# Patient Record
Sex: Female | Born: 1975 | Race: Black or African American | Hispanic: Yes | Marital: Married | Smoking: Never smoker
Health system: Southern US, Community
[De-identification: ages and names within clinical notes are randomized; demographics above are authoritative.]

## PROBLEM LIST (undated history)

## (undated) DIAGNOSIS — D573 Sickle-cell trait: Secondary | ICD-10-CM

## (undated) HISTORY — PX: TUBAL LIGATION: SHX77

---

## 1998-10-25 ENCOUNTER — Other Ambulatory Visit: Admission: RE | Admit: 1998-10-25 | Discharge: 1998-10-25 | Payer: Self-pay | Admitting: *Deleted

## 2001-04-23 ENCOUNTER — Other Ambulatory Visit: Admission: RE | Admit: 2001-04-23 | Discharge: 2001-04-23 | Payer: Self-pay | Admitting: Obstetrics and Gynecology

## 2001-05-07 ENCOUNTER — Encounter: Payer: Self-pay | Admitting: Obstetrics and Gynecology

## 2001-05-07 ENCOUNTER — Ambulatory Visit (HOSPITAL_COMMUNITY): Admission: RE | Admit: 2001-05-07 | Discharge: 2001-05-07 | Payer: Self-pay | Admitting: Obstetrics and Gynecology

## 2001-08-30 ENCOUNTER — Inpatient Hospital Stay (HOSPITAL_COMMUNITY): Admission: AD | Admit: 2001-08-30 | Discharge: 2001-09-02 | Payer: Self-pay | Admitting: Obstetrics and Gynecology

## 2001-08-30 ENCOUNTER — Encounter (INDEPENDENT_AMBULATORY_CARE_PROVIDER_SITE_OTHER): Payer: Self-pay | Admitting: *Deleted

## 2004-06-15 ENCOUNTER — Ambulatory Visit: Payer: Self-pay | Admitting: Internal Medicine

## 2004-07-29 ENCOUNTER — Ambulatory Visit: Payer: Self-pay | Admitting: Internal Medicine

## 2010-02-03 ENCOUNTER — Emergency Department (HOSPITAL_BASED_OUTPATIENT_CLINIC_OR_DEPARTMENT_OTHER)
Admission: EM | Admit: 2010-02-03 | Discharge: 2010-02-03 | Payer: Self-pay | Source: Home / Self Care | Admitting: Emergency Medicine

## 2010-10-07 NOTE — Op Note (Signed)
[facility]  Patient:    Megan Horn, Megan Horn Visit Number: [id number] MRN: [id number]          Service Type: OBS Location: 910A 9106 01 Attending Physician:  Osborn Coho Dictated by:   Janeece Riggers Dareen Piano, M.D. Proc. Date: 08/30/01 Admit Date:  08/30/2001 Discharge Date: 09/02/2001                             Operative Report  PREOPERATIVE DIAGNOSIS:       1. Intrauterine pregnancy at 37 weeks estimated gestational age.                               2. History of prior primary low transverse cesarean section.                               3. History of prior ruptured uterine scar, after a V-back attempt.                               4. The patient desires permanent sterilization.  POSTOPERATIVE DIAGNOSES:      1. Intrauterine pregnancy at 37 weeks estimated gestational age.                               2. History of prior primary low transverse cesarean section.                               3. History of prior ruptured uterine scar, after a V-back attempt.                               4. The patient desires permanent sterilization.  PROCEDURES:                   1. Repeat low transverse cesarean section.                               2. Bilateral tubal ligation, Pomeroy.  SURGEON:                      Mark E. Dareen Piano, M.D.  ANESTHESIA:                   Spinal.  ANTIBIOTICS:                  Ancef 1 g.  ESTIMATED BLOOD LOSS:         900 cc.  COMPLICATIONS:                None.  DRAINS:                       Flow to bedside drainage.  SPECIMEN:                     Portion of right and left fallopian tubes sent to pathology.  FINDINGS:                     The  patient had normal appearing fallopian tubes bilaterally.  Normal ovaries bilaterally.  The uterus was normal.  The uterine cavity was normal.  DESCRIPTION OF PROCEDURE:     The patient was taken to the operating room, where a spinal anesthetic was administered without  complications.  She was then placed in the dorsal supine position, with the left lateral tilt.  Once an adequate level was reached, the patient was prepped with Hibiclens and a Foley catheter was placed.  She was then draped in the usual fashion post procedure.   A Pfannenstiel incision was then made through the previous scar. This was taken down to the fascia.  The fascia was entered in the midline and extended laterally with the Mayo scissors.  The rectus muscles were then dissected from the fascia with the Bovie.  Rectus muscle was divided midline and taken superiorly and inferiorly.  parietoperitoneum was entered sharply. The bladder flap was taken down sharply, and a low transverse uterine incision was made at the midline and extended laterally with blunt dissection. Amniotic sac was entered sharply.  The fluid was noted to be clear.  The infant was delivered with the vacuum extractor in the vertex presentation. The cord was then doubly clamped and cut, and the infant handed to the awaiting NICU team.  Cord blood was then obtained.  The placenta was then manually removed.  Uterine cavity was wiped with a wet lap. The uterine incision was closed in a single layer of 0 chromic in a running, locking fashion.  The bladder flap was then closed.  The right fallopian tube was then grasped with a Babcock in the isthmic portion.  A 2 cm portion was then doubly ligated with O PDS.  The knuckle was then excised, both ostia were visualized.  The specimen was handed off.  A similar procedure was performed on the opposite side.  The uterus was then placed into the abdominal cavity, and the abdominal gutters were wiped with a wet lap.  The uterine incision was again checked, and felt to be hemostatic.  The parietoperitoneum and rectus muscles were approximated midline, using 3-0 chromic in running fashion.  The fascia was closed using 0 Monocryl in a running fashion.  The subcuticular tissue  was made hemostatic with the Bovie.  Stainless-steel clips were used to close the skin.  The patient tolerated the procedure well. She was taken to the recovery room in stable condition.  Instrument and lap counts were correct x2. Dictated by:   Janeece Riggers Dareen Piano, M.D. Attending Physician:  Osborn Coho DD:  08/30/01 TD:  09/01/01 Job: [id number]

## 2010-10-07 NOTE — Discharge Summary (Signed)
[facility]  Patient:    Megan Horn, Megan Horn Visit Number: [id number] MRN: [id number]          Service Type: OBS Location: 910A 9106 01 Attending Physician:  Osborn Coho Dictated by:   Leilani Able, P.A. Admit Date:  08/30/2001 Discharge Date: 09/02/2001                             Discharge Summary  FINAL DIAGNOSES:              1. Intrauterine pregnancy at 37 weeks estimated                                  gestational age.                               2. History of prior primary low transverse                                  cesarean section.                               3. History of prior ruptured uterine scar after                                  an attempted vaginal birth after cesarean                                  section.                               4. Patient desires permanent sterilization.  PROCEDURE:                    1. Repeat low transverse cesarean section.                               2. Bilateral tubal ligation using the Pomeroy                                  method.  SURGEON:                      Mark E. Dareen Piano, M.D.  COMPLICATIONS:                None.  HISTORY OF PRESENT ILLNESS:   This 35 year old, G6, P2-0-3-2 presents at [redacted] weeks gestation to triage with spontaneous rupture of membrane.  The patient was scheduled for repeat cesarean section on September 16, 2001.  She does have a history of a ruptured uterine scar during VBAC.  The patient is therefore, taken to the operating room for a repeat cesarean section and a tubal ligation.  HOSPITAL COURSE:              The patient was taken to the operating room by Dr. Malva Limes on August 30, 2001 where a repeat low transverse cesarean section was performed with the delivery of a 6 pounds 10 ounces female infant with Apgars of 9 and 9.  Delivery went without complication.  The patient had a bilateral tubal ligation performed using the Pomeroy method.   The procedure went without complication.  The patients postoperative course was benign without significant fevers.  She was felt ready for discharge on postoperative day three.  She was sent home on a regular diet.  She was told to decrease activity.  She was told to continue prenatal vitamin. She was given a prescription for Percocet one to two every four hours as needed for pain.  She was to follow up in the office on September 03, 2001 to remove her staples. Dictated by:   Leilani Able, P.A. Attending Physician:  Osborn Coho DD:  09/19/01 TD:  09/23/01 Job: [id number]

## 2011-07-25 ENCOUNTER — Emergency Department (HOSPITAL_COMMUNITY): Payer: Managed Care, Other (non HMO)

## 2011-07-25 ENCOUNTER — Encounter (HOSPITAL_COMMUNITY): Payer: Self-pay | Admitting: Emergency Medicine

## 2011-07-25 ENCOUNTER — Emergency Department (HOSPITAL_COMMUNITY)
Admission: EM | Admit: 2011-07-25 | Discharge: 2011-07-25 | Disposition: A | Discharge status: Home / Self Care | Payer: Managed Care, Other (non HMO) | Attending: Emergency Medicine | Admitting: Emergency Medicine

## 2011-07-25 ENCOUNTER — Other Ambulatory Visit: Payer: Self-pay

## 2011-07-25 DIAGNOSIS — R112 Nausea with vomiting, unspecified: Secondary | ICD-10-CM | POA: Insufficient documentation

## 2011-07-25 DIAGNOSIS — R079 Chest pain, unspecified: Secondary | ICD-10-CM | POA: Insufficient documentation

## 2011-07-25 DIAGNOSIS — K529 Noninfective gastroenteritis and colitis, unspecified: Secondary | ICD-10-CM

## 2011-07-25 DIAGNOSIS — R197 Diarrhea, unspecified: Secondary | ICD-10-CM | POA: Insufficient documentation

## 2011-07-25 DIAGNOSIS — D573 Sickle-cell trait: Secondary | ICD-10-CM | POA: Insufficient documentation

## 2011-07-25 HISTORY — DX: Sickle-cell trait: D57.3

## 2011-07-25 LAB — COMPREHENSIVE METABOLIC PANEL
ALT: 14 U/L (ref 0–35)
AST: 17 U/L (ref 0–37)
Calcium: 9.3 mg/dL (ref 8.4–10.5)
Sodium: 141 mEq/L (ref 135–145)
Total Protein: 7.8 g/dL (ref 6.0–8.3)

## 2011-07-25 LAB — DIFFERENTIAL
Basophils Absolute: 0 10*3/uL (ref 0.0–0.1)
Eosinophils Absolute: 0.1 10*3/uL (ref 0.0–0.7)
Eosinophils Relative: 1 % (ref 0–5)
Lymphocytes Relative: 16 % (ref 12–46)
Neutrophils Relative %: 77 % (ref 43–77)

## 2011-07-25 LAB — CBC
MCV: 70.6 fL — ABNORMAL LOW (ref 78.0–100.0)
Platelets: 289 10*3/uL (ref 150–400)
RDW: 17.5 % — ABNORMAL HIGH (ref 11.5–15.5)
WBC: 6.8 10*3/uL (ref 4.0–10.5)

## 2011-07-25 MED ORDER — ONDANSETRON HCL 4 MG PO TABS: 4.0000 mg | ORAL_TABLET | Freq: Four times a day (QID) | ORAL | Status: AC

## 2011-07-25 MED ORDER — ONDANSETRON 4 MG PO TBDP
8.0000 mg | ORAL_TABLET | Freq: Once | ORAL | Status: AC
  Administered 2011-07-25: 8 mg via ORAL
  Filled 2011-07-25: qty 2

## 2011-07-25 NOTE — ED Notes (Signed)
Started w/ cough then vomiting and now had chest pain  After she vomited so hard she had blood in it

## 2011-07-25 NOTE — ED Provider Notes (Signed)
History     CSN: [id number]  Arrival date & time 07/25/11  1243   First MD Initiated Contact with Patient 07/25/11 1626      Chief Complaint  Patient presents with  . Emesis    (Consider location/radiation/quality/duration/timing/severity/associated sxs/prior treatment) HPI Comments: Patient presents today with symptoms for the last 2 days of nausea, vomiting diarrhea.  No focal abdominal pain.  She did note that she had some vomiting with a small amount of blood in it and that concerned her.  She had some chest pain after an episode of emesis as well.  She notes that some similar symptoms have been found in other family members in the same household.  Last menstrual period was this past week and normal for her.  Patient is a 36 y.o. female presenting with vomiting. The history is provided by the patient. No language interpreter was used.  Emesis  This is a new problem. The current episode started more than 2 days ago. The problem occurs 2 to 4 times per day. The problem has been gradually improving. The emesis has an appearance of stomach contents. There has been no fever. Associated symptoms include diarrhea. Pertinent negatives include no abdominal pain, no chills, no cough, no fever and no headaches.    Past Medical History  Diagnosis Date  . Sickle-cell trait     Past Surgical History  Procedure Date  . Cesarean section     History reviewed. No pertinent family history.  History  Substance Use Topics  . Smoking status: Never Smoker   . Smokeless tobacco: Not on file  . Alcohol Use: Yes    OB History    Grav Para Term Preterm Abortions TAB SAB Ect Mult Living                  Review of Systems  Constitutional: Negative.  Negative for fever and chills.  HENT: Negative.   Eyes: Negative.  Negative for discharge and redness.  Respiratory: Negative.  Negative for cough and shortness of breath.   Cardiovascular: Negative.  Negative for chest pain.  Gastrointestinal:  Positive for nausea, vomiting and diarrhea. Negative for abdominal pain.  Genitourinary: Negative.  Negative for dysuria and vaginal discharge.  Musculoskeletal: Negative.  Negative for back pain.  Skin: Negative.  Negative for color change and rash.  Neurological: Negative.  Negative for syncope and headaches.  Hematological: Negative.  Negative for adenopathy.  Psychiatric/Behavioral: Negative.  Negative for confusion.  All other systems reviewed and are negative.    Allergies  Review of patient's allergies indicates no known allergies.  Home Medications   Current Outpatient Rx  Name Route Sig Dispense Refill  . KAOLIN-PECTIN 5.85-0.13 GM/30ML PO SUSP Oral Take 30 mLs by mouth 2 (two) times daily as needed. For indigestion    . NAPROXEN SODIUM 220 MG PO TABS Oral Take 220 mg by mouth 2 (two) times daily as needed. For pain      BP 124/92  Pulse 84  Temp 98.2 F (36.8 C)  Resp 16  SpO2 100%  LMP 07/21/2011  Physical Exam  Nursing note and vitals reviewed. Constitutional: She is oriented to person, place, and time. She appears well-developed and well-nourished.  Non-toxic appearance. She does not have a sickly appearance.  HENT:  Head: Normocephalic and atraumatic.  Eyes: Conjunctivae, EOM and lids are normal. Pupils are equal, round, and reactive to light. No scleral icterus.  Neck: Trachea normal and normal range of motion. Neck supple.  Cardiovascular:  Normal rate, regular rhythm and normal heart sounds.   Pulmonary/Chest: Effort normal and breath sounds normal. No respiratory distress. She has no wheezes. She has no rales.  Abdominal: Soft. Normal appearance. She exhibits no distension. There is no tenderness. There is no rebound, no guarding and no CVA tenderness.  Musculoskeletal: Normal range of motion.  Neurological: She is alert and oriented to person, place, and time. She has normal strength.  Skin: Skin is warm, dry and intact. No rash noted.  Psychiatric: She  has a normal mood and affect. Her behavior is normal. Judgment and thought content normal.    ED Course  Procedures (including critical care time)  Labs Reviewed  CBC - Abnormal; Notable for the following:    Hemoglobin 10.7 (*)    HCT 32.6 (*)    MCV 70.6 (*)    MCH 23.2 (*)    RDW 17.5 (*)    All other components within normal limits  COMPREHENSIVE METABOLIC PANEL - Abnormal; Notable for the following:    Potassium 3.3 (*)    Total Bilirubin 0.2 (*)    All other components within normal limits  DIFFERENTIAL  TROPONIN I   Dg Chest 2 View  07/25/2011  *RADIOLOGY REPORT*  Clinical Data: Chest pain, cough, fever  CHEST - 2 VIEW  Comparison: None.  Findings: The lungs are clear.  Mediastinal contours are normal. The heart is within normal limits in size.  No bony abnormality is seen.  IMPRESSION: No active lung disease.  Original Report Authenticated By: Juline Patch, M.D.     No diagnosis found.    MDM  Patient with likely gastroenteritis given that other family members have had similar symptoms of vomiting and diarrhea over the last few days.  She has a soft benign abdominal exam at this time.  She has normal vital signs at this time.  Patient has not had any emesis since earlier this morning as well.  I'll give the patient Zofran here and Zofran to be used at home.  She's been counseled regarding BRAT diet and continued hydration at home.        Nat Christen, MD 07/25/11 [phone]

## 2011-07-25 NOTE — Discharge Instructions (Signed)
Viral Gastroenteritis   Viral gastroenteritis is also known as stomach flu. This condition affects the stomach and intestinal tract. It can cause sudden diarrhea and vomiting. The illness typically lasts 3 to 8 days. Most people develop an immune response that eventually gets rid of the virus. While this natural response develops, the virus can make you quite ill.   CAUSES   Many different viruses can cause gastroenteritis, such as rotavirus or noroviruses. You can catch one of these viruses by consuming contaminated food or water. You may also catch a virus by sharing utensils or other personal items with an infected person or by touching a contaminated surface.   SYMPTOMS   The most common symptoms are diarrhea and vomiting. These problems can cause a severe loss of body fluids (dehydration) and a body salt (electrolyte) imbalance. Other symptoms may include:   Fever.   Headache.   Fatigue.   Abdominal pain.   DIAGNOSIS   Your caregiver can usually diagnose viral gastroenteritis based on your symptoms and a physical exam. A stool sample may also be taken to test for the presence of viruses or other infections.   TREATMENT   This illness typically goes away on its own. Treatments are aimed at rehydration. The most serious cases of viral gastroenteritis involve vomiting so severely that you are not able to keep fluids down. In these cases, fluids must be given through an intravenous line (IV).   HOME CARE INSTRUCTIONS   Drink enough fluids to keep your urine clear or pale yellow. Drink small amounts of fluids frequently and increase the amounts as tolerated.   Ask your caregiver for specific rehydration instructions.   Avoid:   Foods high in sugar.   Alcohol.   Carbonated drinks.   Tobacco.   Juice.   Caffeine drinks.   Extremely hot or cold fluids.   Fatty, greasy foods.   Too much intake of anything at one time.   Dairy products until 24 to 48 hours after diarrhea stops.   You may consume probiotics. Probiotics  are active cultures of beneficial bacteria. They may lessen the amount and number of diarrheal stools in adults. Probiotics can be found in yogurt with active cultures and in supplements.   Wash your hands well to avoid spreading the virus.   Only take over-the-counter or prescription medicines for pain, discomfort, or fever as directed by your caregiver. Do not give aspirin to children. Antidiarrheal medicines are not recommended.   Ask your caregiver if you should continue to take your regular prescribed and over-the-counter medicines.   Keep all follow-up appointments as directed by your caregiver.   SEEK IMMEDIATE MEDICAL CARE IF:   You are unable to keep fluids down.   You do not urinate at least once every 6 to 8 hours.   You develop shortness of breath.   You notice blood in your stool or vomit. This may look like coffee grounds.   You have abdominal pain that increases or is concentrated in one small area (localized).   You have persistent vomiting or diarrhea.   You have a fever.   The patient is a child younger than 3 months, and he or she has a fever.   The patient is a child older than 3 months, and he or she has a fever and persistent symptoms.   The patient is a child older than 3 months, and he or she has a fever and symptoms suddenly get worse.   The patient   is a baby, and he or she has no tears when crying.   MAKE SURE YOU:   Understand these instructions.   Will watch your condition.   Will get help right away if you are not doing well or get worse.   Document Released: 05/08/2005 Document Revised: 04/27/2011 Document Reviewed: 02/22/2011   ExitCare Patient Information 2012 [employer].     Diet for Diarrhea, Adult   Having frequent, runny stools (diarrhea) has many causes. Diarrhea may be caused or worsened by food or drink. Diarrhea may be relieved by changing your diet.   IF YOU ARE NOT TOLERATING SOLID FOODS:   Drink enough water and fluids to keep your urine clear or pale yellow.   Avoid  sugary drinks and sodas as well as milk-based beverages.   Avoid beverages containing caffeine and alcohol.   You may try rehydrating beverages. You can make your own by following this recipe:    tsp table salt.    tsp baking soda.   ? tsp salt substitute (potassium chloride).   1 tbs + 1 tsp sugar.   1 qt water.   As your stools become more solid, you can start eating solid foods. Add foods one at a time. If a certain food causes your diarrhea to get worse, avoid that food and try other foods. A low fiber, low-fat, and lactose-free diet is recommended. Small, frequent meals may be better tolerated.   Starches   Allowed: White, French, and pita breads, plain rolls, buns, bagels. Plain muffins, matzo. Soda, saltine, or graham crackers. Pretzels, melba toast, zwieback. Cooked cereals made with water: cornmeal, farina, cream cereals. Dry cereals: refined corn, wheat, rice. Potatoes prepared any way without skins, refined macaroni, spaghetti, noodles, refined rice.   Avoid: Bread, rolls, or crackers made with whole wheat, multi-grains, rye, bran seeds, nuts, or coconut. Corn tortillas or taco shells. Cereals containing whole grains, multi-grains, bran, coconut, nuts, or raisins. Cooked or dry oatmeal. Coarse wheat cereals, granola. Cereals advertised as "high-fiber." Potato skins. Whole grain pasta, wild or brown rice. Popcorn. Sweet potatoes/yams. Sweet rolls, doughnuts, waffles, pancakes, sweet breads.   Vegetables   Allowed: Strained tomato and vegetable juices. Most well-cooked and canned vegetables without seeds. Fresh: Tender lettuce, cucumber without the skin, cabbage, spinach, bean sprouts.   Avoid: Fresh, cooked, or canned: Artichokes, baked beans, beet greens, broccoli, Brussels sprouts, corn, kale, legumes, peas, sweet potatoes. Cooked: Green or red cabbage, spinach. Avoid large servings of any vegetables, because vegetables shrink when cooked, and they contain more fiber per serving than fresh vegetables.    Fruit   Allowed: All fruit juices except prune juice. Cooked or canned: Apricots, applesauce, cantaloupe, cherries, fruit cocktail, grapefruit, grapes, kiwi, mandarin oranges, peaches, pears, plums, watermelon. Fresh: Apples without skin, ripe banana, grapes, cantaloupe, cherries, grapefruit, peaches, oranges, plums. Keep servings limited to  cup or 1 piece.   Avoid: Fresh: Apple with skin, apricots, mango, pears, raspberries, strawberries. Prune juice, stewed or dried prunes. Dried fruits, raisins, dates. Large servings of all fresh fruits.   Meat and Meat Substitutes   Allowed: Ground or well-cooked tender beef, ham, veal, lamb, pork, or poultry. Eggs, plain cheese. Fish, oysters, shrimp, lobster, other seafoods. Liver, organ meats.   Avoid: Tough, fibrous meats with gristle. Peanut butter, smooth or chunky. Cheese, nuts, seeds, legumes, dried peas, beans, lentils.   Milk   Allowed: Yogurt, lactose-free milk, kefir, drinkable yogurt, buttermilk, soy milk.   Avoid: Milk, chocolate milk, beverages made with milk,   such as milk shakes.   Soups   Allowed: Bouillon, broth, or soups made from allowed foods. Any strained soup.   Avoid: Soups made from vegetables that are not allowed, cream or milk-based soups.   Desserts and Sweets   Allowed: Sugar-free gelatin, sugar-free frozen ice pops made without sugar alcohol.   Avoid: Plain cakes and cookies, pie made with allowed fruit, pudding, custard, cream pie. Gelatin, fruit, ice, sherbet, frozen ice pops. Ice cream, ice milk without nuts. Plain hard candy, honey, jelly, molasses, syrup, sugar, chocolate syrup, gumdrops, marshmallows.   Fats and Oils   Allowed: Avoid any fats and oils.   Avoid: Seeds, nuts, olives, avocados. Margarine, butter, cream, mayonnaise, salad oils, plain salad dressings made from allowed foods. Plain gravy, crisp bacon without rind.   Beverages   Allowed: Water, decaffeinated teas, oral rehydration solutions, sugar-free beverages.   Avoid: Fruit  juices, caffeinated beverages (coffee, tea, soda or pop), alcohol, sports drinks, or lemon-lime soda or pop.   Condiments   Allowed: Ketchup, mustard, horseradish, vinegar, cream sauce, cheese sauce, cocoa powder. Spices in moderation: allspice, basil, bay leaves, celery powder or leaves, cinnamon, cumin powder, curry powder, ginger, mace, marjoram, onion or garlic powder, oregano, paprika, parsley flakes, ground pepper, rosemary, sage, savory, tarragon, thyme, turmeric.   Avoid: Coconut, honey.   Weight Monitoring: Weigh yourself every day. You should weigh yourself in the morning after you urinate and before you eat breakfast. Wear the same amount of clothing when you weigh yourself. Record your weight daily. Bring your recorded weights to your clinic visits. Tell your caregiver right away if you have gained 3 lb/1.4 kg or more in 1 day, 5 lb/2.3 kg in a week, or whatever amount you were told to report.   SEEK IMMEDIATE MEDICAL CARE IF:   You are unable to keep fluids down.   You start to throw up (vomit) or diarrhea keeps coming back (persistent).   Abdominal pain develops, increases, or can be felt in one place (localizes).   You have an oral temperature above 102 F (38.9 C), not controlled by medicine.   Diarrhea contains blood or mucus.   You develop excessive weakness, dizziness, fainting, or extreme thirst.   MAKE SURE YOU:   Understand these instructions.   Will watch your condition.   Will get help right away if you are not doing well or get worse.   Document Released: 07/29/2003 Document Revised: 04/27/2011 Document Reviewed: 11/19/2008   [employer] Patient Information 2012 [employer].

## 2011-10-04 ENCOUNTER — Encounter (HOSPITAL_COMMUNITY): Payer: Self-pay | Admitting: *Deleted

## 2011-10-04 ENCOUNTER — Emergency Department (HOSPITAL_COMMUNITY)
Admission: EM | Admit: 2011-10-04 | Discharge: 2011-10-05 | Disposition: A | Discharge status: Home / Self Care | Payer: Managed Care, Other (non HMO) | Attending: Emergency Medicine | Admitting: Emergency Medicine

## 2011-10-04 DIAGNOSIS — F3289 Other specified depressive episodes: Secondary | ICD-10-CM | POA: Insufficient documentation

## 2011-10-04 DIAGNOSIS — F329 Major depressive disorder, single episode, unspecified: Secondary | ICD-10-CM | POA: Insufficient documentation

## 2011-10-04 DIAGNOSIS — R45851 Suicidal ideations: Secondary | ICD-10-CM | POA: Insufficient documentation

## 2011-10-04 LAB — CBC
MCHC: 33.4 g/dL (ref 30.0–36.0)
MCV: 73.5 fL — ABNORMAL LOW (ref 78.0–100.0)
RBC: 4.6 MIL/uL (ref 3.87–5.11)

## 2011-10-04 LAB — URINALYSIS, ROUTINE W REFLEX MICROSCOPIC
Glucose, UA: NEGATIVE mg/dL
Hgb urine dipstick: NEGATIVE
Specific Gravity, Urine: 1.012 (ref 1.005–1.030)
pH: 7.5 (ref 5.0–8.0)

## 2011-10-04 LAB — COMPREHENSIVE METABOLIC PANEL
AST: 13 U/L (ref 0–37)
Albumin: 3.9 g/dL (ref 3.5–5.2)
Alkaline Phosphatase: 58 U/L (ref 39–117)
BUN: 10 mg/dL (ref 6–23)
CO2: 27 mEq/L (ref 19–32)
Chloride: 105 mEq/L (ref 96–112)
GFR calc non Af Amer: >90 mL/min (ref >90)
Potassium: 3.6 mEq/L (ref 3.5–5.1)
Total Bilirubin: 0.2 mg/dL — ABNORMAL LOW (ref 0.3–1.2)

## 2011-10-04 LAB — DIFFERENTIAL
Basophils Absolute: 0 10*3/uL (ref 0.0–0.1)
Basophils Relative: 1 % (ref 0–1)
Lymphocytes Relative: 27 % (ref 12–46)
Monocytes Absolute: 0.5 10*3/uL (ref 0.1–1.0)
Monocytes Relative: 7 % (ref 3–12)
Neutro Abs: 3.9 10*3/uL (ref 1.7–7.7)
Neutrophils Relative %: 63 % (ref 43–77)

## 2011-10-04 LAB — RAPID URINE DRUG SCREEN, HOSP PERFORMED
Amphetamines: NOT DETECTED
Benzodiazepines: NOT DETECTED
Opiates: NOT DETECTED

## 2011-10-04 LAB — PREGNANCY, URINE: Preg Test, Ur: NEGATIVE

## 2011-10-04 NOTE — ED Notes (Signed)
Pt Belongings bag, in locker number ONE

## 2011-10-04 NOTE — ED Notes (Signed)
Security wanded pt ?

## 2011-10-04 NOTE — ED Notes (Signed)
The pt says she has had suicidal thoughts since saturday

## 2011-10-04 NOTE — ED Provider Notes (Cosign Needed Addendum)
History     CSN: [id number]  Arrival date & time 10/04/11  1749   First MD Initiated Contact with Patient 10/04/11 1927      Chief Complaint  Patient presents with  . Suicidal    (Consider location/radiation/quality/duration/timing/severity/associated sxs/prior treatment) HPI Patient depressed for the past several weeks over recent death of her mother and mother's day having passed she was treated by her primary care physician with Zoloft for 2 weeks which she feels made her feel suicidal patient stopped Zoloft presently she has leading to suicidal thoughts and continues to feel depressed she vehemently denies intent to harm herself stating she would never do it because "I believe in God and have a family" no other complaint no treatment prior to coming here Past Medical History  Diagnosis Date  . Sickle-cell trait     Past Surgical History  Procedure Date  . Cesarean section     No family history on file.  History  Substance Use Topics  . Smoking status: Never Smoker   . Smokeless tobacco: Not on file  . Alcohol Use: Yes    OB History    Grav Para Term Preterm Abortions TAB SAB Ect Mult Living                  Review of Systems  Constitutional: Negative.   HENT: Negative.   Respiratory: Negative.   Cardiovascular: Negative.   Gastrointestinal: Negative.   Musculoskeletal: Negative.   Skin: Negative.   Neurological: Negative.   Hematological: Negative.   Psychiatric/Behavioral: Positive for dysphoric mood.  All other systems reviewed and are negative.    Allergies  Review of patient's allergies indicates no known allergies.  Home Medications   Current Outpatient Rx  Name Route Sig Dispense Refill  . LORATADINE 10 MG PO TABS Oral Take 10 mg by mouth daily as needed. For  Allergies    . PRENATAL MULTIVITAMIN CH Oral Take 1 tablet by mouth daily.    . SERTRALINE HCL 50 MG PO TABS Oral Take 50 mg by mouth daily.    Marland Kitchen NAPROXEN SODIUM 220 MG PO TABS Oral  Take 220 mg by mouth 2 (two) times daily as needed. For pain      BP 132/86  Pulse 81  Temp(Src) 98.6 F (37 C) (Oral)  SpO2 100%  LMP 08/28/2011  Physical Exam  Nursing note and vitals reviewed. Constitutional: She appears well-developed and well-nourished.  HENT:  Head: Normocephalic and atraumatic.  Eyes: Conjunctivae are normal. Pupils are equal, round, and reactive to light.  Neck: Neck supple. No tracheal deviation present. No thyromegaly present.  Cardiovascular: Normal rate.   No murmur heard. Pulmonary/Chest: Effort normal.  Abdominal: Soft. Bowel sounds are normal. She exhibits no distension. There is no tenderness.  Musculoskeletal: Normal range of motion. She exhibits no edema and no tenderness.  Neurological: She is alert. No cranial nerve deficit. Coordination normal.  Skin: Skin is warm and dry. No rash noted.  Psychiatric: Judgment and thought content normal.       tearful    ED Course  Procedures (including critical care time)  Labs Reviewed  URINALYSIS, ROUTINE W REFLEX MICROSCOPIC - Abnormal; Notable for the following:    Leukocytes, UA TRACE (*)    All other components within normal limits  CBC - Abnormal; Notable for the following:    Hemoglobin 11.3 (*)    HCT 33.8 (*)    MCV 73.5 (*)    MCH 24.6 (*)  RDW 17.3 (*)    All other components within normal limits  PREGNANCY, URINE  URINE RAPID DRUG SCREEN (HOSP PERFORMED)  DIFFERENTIAL  URINE MICROSCOPIC-ADD ON  COMPREHENSIVE METABOLIC PANEL  ETHANOL   No results found.   No diagnosis found.  Results for orders placed during the hospital encounter of 10/04/11  URINALYSIS, ROUTINE W REFLEX MICROSCOPIC      Component Value Range   Color, Urine YELLOW  YELLOW    APPearance CLEAR  CLEAR    Specific Gravity, Urine 1.012  1.005 - 1.030    pH 7.5  5.0 - 8.0    Glucose, UA NEGATIVE  NEGATIVE (mg/dL)   Hgb urine dipstick NEGATIVE  NEGATIVE    Bilirubin Urine NEGATIVE  NEGATIVE    Ketones, ur  NEGATIVE  NEGATIVE (mg/dL)   Protein, ur NEGATIVE  NEGATIVE (mg/dL)   Urobilinogen, UA 0.2  0.0 - 1.0 (mg/dL)   Nitrite NEGATIVE  NEGATIVE    Leukocytes, UA TRACE (*) NEGATIVE   PREGNANCY, URINE      Component Value Range   Preg Test, Ur NEGATIVE  NEGATIVE   URINE RAPID DRUG SCREEN (HOSP PERFORMED)      Component Value Range   Opiates NONE DETECTED  NONE DETECTED    Cocaine NONE DETECTED  NONE DETECTED    Benzodiazepines NONE DETECTED  NONE DETECTED    Amphetamines NONE DETECTED  NONE DETECTED    Tetrahydrocannabinol NONE DETECTED  NONE DETECTED    Barbiturates NONE DETECTED  NONE DETECTED   CBC      Component Value Range   WBC 6.2  4.0 - 10.5 (K/uL)   RBC 4.60  3.87 - 5.11 (MIL/uL)   Hemoglobin 11.3 (*) 12.0 - 15.0 (g/dL)   HCT 11.9 (*) 14.7 - 46.0 (%)   MCV 73.5 (*) 78.0 - 100.0 (fL)   MCH 24.6 (*) 26.0 - 34.0 (pg)   MCHC 33.4  30.0 - 36.0 (g/dL)   RDW 82.9 (*) 56.2 - 15.5 (%)   Platelets 290  150 - 400 (K/uL)  DIFFERENTIAL      Component Value Range   Neutrophils Relative 63  43 - 77 (%)   Neutro Abs 3.9  1.7 - 7.7 (K/uL)   Lymphocytes Relative 27  12 - 46 (%)   Lymphs Abs 1.7  0.7 - 4.0 (K/uL)   Monocytes Relative 7  3 - 12 (%)   Monocytes Absolute 0.5  0.1 - 1.0 (K/uL)   Eosinophils Relative 2  0 - 5 (%)   Eosinophils Absolute 0.1  0.0 - 0.7 (K/uL)   Basophils Relative 1  0 - 1 (%)   Basophils Absolute 0.0  0.0 - 0.1 (K/uL)  COMPREHENSIVE METABOLIC PANEL      Component Value Range   Sodium 140  135 - 145 (mEq/L)   Potassium 3.6  3.5 - 5.1 (mEq/L)   Chloride 105  96 - 112 (mEq/L)   CO2 27  19 - 32 (mEq/L)   Glucose, Bld 71  70 - 99 (mg/dL)   BUN 10  6 - 23 (mg/dL)   Creatinine, Ser 1.30  0.50 - 1.10 (mg/dL)   Calcium 9.1  8.4 - 86.5 (mg/dL)   Total Protein 7.6  6.0 - 8.3 (g/dL)   Albumin 3.9  3.5 - 5.2 (g/dL)   AST 13  0 - 37 (U/L)   ALT 9  0 - 35 (U/L)   Alkaline Phosphatase 58  39 - 117 (U/L)   Total Bilirubin 0.2 (*)  0.3 - 1.2 (mg/dL)   GFR calc non Af  Amer >90  >90 (mL/min)   GFR calc Af Amer >90  >90 (mL/min)  ETHANOL      Component Value Range   Alcohol, Ethyl (B) <11  0 - 11 (mg/dL)  URINE MICROSCOPIC-ADD ON      Component Value Range   Squamous Epithelial / LPF RARE  RARE    WBC, UA 0-2  <3 (WBC/hpf)   No results found.   MDM  I did not feel patient to be suicide risk. Specialist on-call tele-psych called to evaluate patient to make recommendations for psychotropic medications.[facility] called to arrange for outpatient and ongoing psychiatric therapy Pt signed out to DrMarland Kitchen Ardean Larsen 930 pm Dx : depression       Doug Sou, MD 10/04/11 1610  Doug Sou, MD 10/04/11 2135

## 2011-10-04 NOTE — ED Notes (Signed)
Security notified to wand pt 

## 2011-10-04 NOTE — ED Notes (Signed)
Pt returned from shower

## 2011-10-05 MED ORDER — CLONAZEPAM 0.5 MG PO TABS: 0.5000 mg | ORAL_TABLET | Freq: Every evening | ORAL | Status: DC | PRN

## 2011-10-05 MED ORDER — MIRTAZAPINE 15 MG PO TABS: 15.0000 mg | ORAL_TABLET | Freq: Every day | ORAL | Status: DC

## 2011-10-05 NOTE — Discharge Instructions (Signed)
You have signs of possible anxiety and/or depression. This is a very common problem.  °Be sure to call your caregiver and arrange for follow-up care as suggested by our staff. °RETURN IMMEDIATELY IF DEVELOP threat to harm self or others, suicidal or homicidal thoughts, hallucinations or confusion, unable to be cared for at home or uncontrolled behavior, or other concerns. °

## 2011-10-05 NOTE — ED Provider Notes (Signed)
Pt seen by Telepsych recs discharge, Pt agrees, no SI/HI, denies threat to harm self/others and has f/u recs from [facility].  Hurman Horn, MD 10/06/11 2300

## 2011-10-05 NOTE — BH Assessment (Signed)
Assessment Note   Megan Horn is an 36 y.o. female who presents to [facility] after having some suicidal ideation and mood swings for a couple of days.  Megan Horn reports that her mother died in 07/02/2022 and she began having panic attacks multiple times a day along with depression so her PCP prescribed Zoloft.  She noticed a continued change in her mood and the onset of suicidal ideation sometimes with a plan to overdose, but no intent.  These thoughts frightened her and unable to contact her PCP, she discontinued the Zoloft and presented to ED.  Dr Ethelda Chick, EDP, ordered a telepsych who prescribed new medications.  The patient is able to contract for safety and does not require inpatient admission, but will benefit from intensive outpatient to develop coping skills and begin processing her grief in addition to establishing psychiatric med evaluation.  The patient may begin Psych IOP at [facility] on May 23 at 0900.  Pt signed Engineer, manufacturing systems.  Dr Fonnie Jarvis, EDP, is in agreement with teh disposition.  Axis I: Depressive Disorder NOS Axis II: Deferred Axis III:  Past Medical History  Diagnosis Date  . Sickle-cell trait    Axis IV: problems with primary support group Axis V: 51-60 moderate symptoms  Past Medical History:  Past Medical History  Diagnosis Date  . Sickle-cell trait     Past Surgical History  Procedure Date  . Cesarean section     Family History: No family history on file.  Social History:  reports that she has never smoked. She does not have any smokeless tobacco history on file. She reports that she drinks alcohol. Her drug history not on file.  Additional Social History:  Alcohol / Drug Use History of alcohol / drug use?: No history of alcohol / drug abuse Allergies: No Known Allergies  Home Medications:  (Not in a hospital admission)  OB/GYN Status:  Patient's last menstrual period was 08/28/2011.  General Assessment Data Location of Assessment: [facility] ED Living  Arrangements: Spouse/significant other;Children (19,18,10) Can pt return to current living arrangement?: Yes Admission Status: Voluntary Is patient capable of signing voluntary admission?: Yes Transfer from: Acute Hospital Referral Source: Self/Family/Friend  Education Status Is patient currently in school?: No Highest grade of school patient has completed: college  Risk to self Suicidal Ideation: No-Not Currently/Within Last 6 Months Suicidal Intent: No Is patient at risk for suicide?: No Suicidal Plan?: No-Not Currently/Within Last 6 Months Access to Means: No What has been your use of drugs/alcohol within the last 12 months?: n/a Previous Attempts/Gestures: No How many times?: 0  Other Self Harm Risks: 0 Triggers for Past Attempts: None known Intentional Self Injurious Behavior: None Family Suicide History: No Recent stressful life event(s): Loss (Comment) (Mother died in 07/02/2022, Started Zoloft) Persecutory voices/beliefs?: No Depression: Yes Depression Symptoms: Despondent;Insomnia;Tearfulness;Isolating;Fatigue;Guilt;Loss of interest in usual pleasures;Feeling angry/irritable Substance abuse history and/or treatment for substance abuse?: No Suicide prevention information given to non-admitted patients: Yes  Risk to Others Homicidal Ideation: No Thoughts of Harm to Others: No Current Homicidal Intent: No Current Homicidal Plan: No Access to Homicidal Means: No History of harm to others?: No Assessment of Violence: None Noted Does patient have access to weapons?: No Criminal Charges Pending?: No Does patient have a court date: No  Psychosis Hallucinations: None noted Delusions: None noted  Mental Status Report Appear/Hygiene: Improved Eye Contact: Good Motor Activity: Freedom of movement Speech: Logical/coherent;Soft Level of Consciousness: Quiet/awake Mood: Depressed Affect: Appropriate to circumstance Anxiety Level: Panic Attacks Panic  attack frequency:  multiple daily Most recent panic attack: 10/04/11 Thought Processes: Coherent;Relevant Judgement: Unimpaired Orientation: Place;Person;Time;Situation Obsessive Compulsive Thoughts/Behaviors: Moderate  Cognitive Functioning Concentration: Decreased Memory: Recent Intact;Remote Intact IQ: Average Insight: Fair Impulse Control: Good Appetite: Poor Weight Loss: 0  Weight Gain: 0  Sleep: Decreased Total Hours of Sleep: 4  Vegetative Symptoms: None  Prior Inpatient Therapy Prior Inpatient Therapy: No  Prior Outpatient Therapy Prior Outpatient Therapy: Yes Prior Therapy Dates: Early 2013 Prior Therapy Facilty/Provider(s): [facility] Reason for Treatment: depression  ADL Screening (condition at time of admission) Patient's cognitive ability adequate to safely complete daily activities?: Yes Patient able to express need for assistance with ADLs?: Yes Independently performs ADLs?: Yes       Abuse/Neglect Assessment (Assessment to be complete while patient is alone) Physical Abuse: Denies Verbal Abuse: Denies Sexual Abuse: Denies Self-Neglect: Denies     Advance Directives (For Healthcare) Advance Directive: Patient does not have advance directive Nutrition Screen Diet: Regular  Additional Information 1:1 In Past 12 Months?: No CIRT Risk: No Elopement Risk: No Does patient have medical clearance?: Yes     Disposition:  Disposition Disposition of Patient: Outpatient treatment;Referred to Type of outpatient treatment: Psych Intensive Outpatient;Adult (To begin on 5/23) Patient referred to: Other (Comment) ([facility] Psych IOP on 5/23)  On Site Evaluation by:  Ethelda Chick Reviewed with Physician:  Marijean Bravo [name] 10/05/2011 1:46 AM

## 2014-04-23 ENCOUNTER — Emergency Department (HOSPITAL_BASED_OUTPATIENT_CLINIC_OR_DEPARTMENT_OTHER)
Admission: EM | Admit: 2014-04-23 | Discharge: 2014-04-23 | Disposition: A | Discharge status: Home / Self Care | Payer: Managed Care, Other (non HMO) | Attending: Emergency Medicine | Admitting: Emergency Medicine

## 2014-04-23 ENCOUNTER — Encounter (HOSPITAL_BASED_OUTPATIENT_CLINIC_OR_DEPARTMENT_OTHER): Payer: Self-pay | Admitting: *Deleted

## 2014-04-23 DIAGNOSIS — Y9289 Other specified places as the place of occurrence of the external cause: Secondary | ICD-10-CM | POA: Diagnosis not present

## 2014-04-23 DIAGNOSIS — Y998 Other external cause status: Secondary | ICD-10-CM | POA: Diagnosis not present

## 2014-04-23 DIAGNOSIS — Z79899 Other long term (current) drug therapy: Secondary | ICD-10-CM | POA: Insufficient documentation

## 2014-04-23 DIAGNOSIS — T1592XA Foreign body on external eye, part unspecified, left eye, initial encounter: Secondary | ICD-10-CM

## 2014-04-23 DIAGNOSIS — T1512XA Foreign body in conjunctival sac, left eye, initial encounter: Secondary | ICD-10-CM | POA: Insufficient documentation

## 2014-04-23 DIAGNOSIS — Y9389 Activity, other specified: Secondary | ICD-10-CM | POA: Insufficient documentation

## 2014-04-23 DIAGNOSIS — Z862 Personal history of diseases of the blood and blood-forming organs and certain disorders involving the immune mechanism: Secondary | ICD-10-CM | POA: Diagnosis not present

## 2014-04-23 DIAGNOSIS — X58XXXA Exposure to other specified factors, initial encounter: Secondary | ICD-10-CM | POA: Insufficient documentation

## 2014-04-23 MED ORDER — TETRACAINE HCL 0.5 % OP SOLN
2.0000 [drp] | Freq: Once | OPHTHALMIC | Status: AC
  Administered 2014-04-23: 2 [drp] via OPHTHALMIC
  Filled 2014-04-23: qty 2

## 2014-04-23 MED ORDER — FLUORESCEIN SODIUM 1 MG OP STRP
1.0000 | ORAL_STRIP | Freq: Once | OPHTHALMIC | Status: AC
  Administered 2014-04-23: 1 via OPHTHALMIC
  Filled 2014-04-23: qty 1

## 2014-04-23 MED ORDER — CIPROFLOXACIN HCL 0.3 % OP SOLN
1.0000 [drp] | OPHTHALMIC | Status: DC
  Administered 2014-04-23: 1 [drp] via OPHTHALMIC
  Filled 2014-04-23: qty 2.5

## 2014-04-23 NOTE — ED Provider Notes (Signed)
CSN: [id number]     Arrival date & time 04/23/14  2021 History  This chart was scribed for Arby BarretteMarcy Miho Monda, MD by Evon Slackerrance Branch, ED Scribe. This patient was seen in room MH05/MH05 and the patient's care was started at 8:47 PM.      Chief Complaint  Patient presents with  . Foreign Body in Eye    Patient is a 38 y.o. female presenting with foreign body in eye. The history is provided by the patient. No language interpreter was used.  Foreign Body in Eye   HPI Comments: Megan Horn is a 38 y.o. female who presents to the Emergency Department complaining of foreign body in left eye onset 8 Am this morning. Pt states that her contact lense is stuck in the left eye and she cant get it out. She states she is having some blurred vision out of the left eye.  She describes the pain as an irritation and itching of the left eye. She states that the eye has been tearing through out the day.   Past Medical History  Diagnosis Date  . Sickle-cell trait    Past Surgical History  Procedure Laterality Date  . Cesarean section    . Tubal ligation     No family history on file. History  Substance Use Topics  . Smoking status: Never Smoker   . Smokeless tobacco: Never Used  . Alcohol Use: Yes     Comment: rare   OB History    No data available     Review of Systems  Eyes: Positive for pain, itching and visual disturbance.     Allergies  Review of patient's allergies indicates no known allergies.  Home Medications   Prior to Admission medications   Medication Sig Start Date End Date Taking? Authorizing Provider  loratadine (CLARITIN) 10 MG tablet Take 10 mg by mouth daily as needed. For  Allergies   Yes Historical Provider, MD  naproxen sodium (ANAPROX) 220 MG tablet Take 220 mg by mouth 2 (two) times daily as needed. For pain   Yes Historical Provider, MD  clonazePAM (KLONOPIN) 0.5 MG tablet Take 1 tablet (0.5 mg total) by mouth at bedtime as needed for anxiety. 10/05/11 11/04/11  Hurman HornJohn  M Bednar, MD  mirtazapine (REMERON) 15 MG tablet Take 1 tablet (15 mg total) by mouth at bedtime. 10/05/11 11/04/11  Hurman HornJohn M Bednar, MD  Prenatal Vit-Fe Fumarate-FA (PRENATAL MULTIVITAMIN) TABS Take 1 tablet by mouth daily.    Historical Provider, MD  sertraline (ZOLOFT) 50 MG tablet Take 50 mg by mouth daily.    Historical Provider, MD   Triage Vitals: BP 132/70 mmHg  Pulse 80  Temp(Src) 98.2 F (36.8 C) (Oral)  Resp 16  Ht 5\' 2"  (1.575 m)  Wt 160 lb (72.576 kg)  BMI 29.26 kg/m2  SpO2 100%  LMP 04/11/2014  Physical Exam  Constitutional: She appears well-developed and well-nourished. No distress.  HENT:  Head: Normocephalic and atraumatic.  Eyes: Conjunctivae and EOM are normal. Pupils are equal, round, and reactive to light. Right eye exhibits no discharge. Left eye exhibits no discharge.    ED Course  FOREIGN BODY REMOVAL Date/Time: 04/23/2014 9:11 PM Performed by: Arby BarrettePFEIFFER, Koben Daman Authorized by: Arby BarrettePFEIFFER, Suraiya Dickerson Consent: Verbal consent obtained. Written consent not obtained. Consent given by: patient Body area: eye Location details: left conjunctiva Local anesthetic: tetracaine drops Anesthetic total: 2 drops Patient sedated: no Localization method: eyelid eversion, magnification and visualized Removal mechanism: moist cotton swab Eye examined with fluorescein.  No fluorescein uptake. Dressing: antibiotic drops Complexity: simple 1 objects recovered. Objects recovered: Torn contact lens Post-procedure assessment: foreign body removed Patient tolerance: Patient tolerated the procedure well with no immediate complications   (including critical care time) DIAGNOSTIC STUDIES: Oxygen Saturation is 100% on RA, normal by my interpretation.    COORDINATION OF CARE: 8:50 PM-Discussed treatment plan which includes removing foreign body with pt at bedside and pt agreed to plan.     Labs Review Labs Reviewed - No data to display  Imaging Review No results found.   EKG  Interpretation None      MDM   Final diagnoses:  Eye foreign body, left, initial encounter   The portion of torn contact was removed as outlined above without difficulty. Eye was stained post-removal there is no corneal uptake. Patient has no complaints with the right eye, she did however have me remove the other contact lens without difficulty as she was afraid it might tear or cause her a problem again.   I personally performed the services described in this documentation, which was scribed in my presence. The recorded information has been reviewed and is accurate.     Arby BarretteMarcy Deangleo Passage, MD 04/23/14 2119

## 2014-04-23 NOTE — ED Notes (Signed)
Pt reports she has part of a contact in her left eye- unable to remove it

## 2014-04-23 NOTE — Discharge Instructions (Signed)
Eye, Foreign Body The term foreign body refers to any object near, on the surface of or in the eye that should not be there. A foreign body may be a small speck of dirt or dust, a hair or eyelash, a splinter or any object. CAUSES  Foreign bodies can get in the eye by:  Flying pieces of something that was broken or destroyed (debris).  A sudden injury (trauma) to the eye. SYMPTOMS  Symptoms depend on what the foreign body is and where it is in the eye. The most common locations are:  On the inner surface of the upper or lower eyelids or on the covering of the white part of the eye (conjunctiva). Symptoms in this location are:  Irritating and painful, especially when blinking.  Feeling like something is in the eye.  On the surface of the clear covering on the front of the eye (cornea). A corneal foreign body has symptoms that:  Are painful and irritating since the cornea is very sensitive.  Form small "rust rings" around a metallic foreign body. Metallic foreign bodies stick more firmly to the surface of the cornea.  Inside the eyeball. Infection can happen fast and can be hard to treat with antibiotics. This is an extremely dangerous situation. Foreign bodies inside the eye can threaten vision. A person may even loose their eye. Foreign bodies inside the eye may cause:  Great pain.  Immediate loss of vision. DIAGNOSIS  Foreign bodies are found during an exam by an eye specialist. Those that are on the eyelids, conjunctiva or cornea are usually (but not always) easily found. When a foreign body is inside the eyeball, a cataract may form almost right away. This makes it hard for an ophthalmologist to find the foreign body. Special tests may be needed, including ultrasound testing, X-rays and CT scans. TREATMENT   Foreign bodies that are on the eyelids, conjunctiva or cornea are often removed easily and painlessly.  If the foreign body has caused a scratch or abrasion of the cornea,  antibiotic drops, ointments and/or a tight patch called a "pressure patch" may be needed. Follow-up exams will be needed for several days until the abrasion heals.  Surgery is needed right away if the foreign body is inside the eyeball. This is a medical emergency. An antibiotic therapy will likely be given to stop an infection. HOME CARE INSTRUCTIONS  The use of eye patches is not universal. Their use varies from state to state and from caregiver to caregiver. If an eye patch was applied:  Keep the eye patch on for as long as directed by your caregiver until the follow-up appointment.  Do not remove the patch to put in medications unless instructed to do so. When replacing the patch, retape it as it was before. Follow the same procedure if the patch becomes loose.  WARNING: Do not drive or operate machinery while the eye is patched. The ability to judge distances will be impaired.  Only take over-the-counter or prescription medicines for pain, discomfort or fever as directed by the caregiver. If no eye patch was applied:  Keep the eye closed as much as possible. Do not rub the eye.  Wear dark glasses as needed to protect the eyes from bright light.  Do not wear contact lenses until the eye feels normal again, or as instructed.  Wear protective eye covering if there is a risk of eye injury. This is important when working with high speed tools.  Only take over-the-counter or   prescription medicines for pain, discomfort or fever as directed by the caregiver. SEEK IMMEDIATE MEDICAL CARE IF:   Pain increases in the eye or the vision changes.  You or your child has problems with the eye patch.  The injury to the eye appears to be getting larger.  There is discharge from the injured eye.  Swelling and/or soreness (inflammation) develops around the affected eye.  You or your child has an oral temperature above 102 F (38.9 C), not controlled by medicine.  Your baby is older than 3  months with a rectal temperature of 102 F (38.9 C) or higher.  Your baby is 3 months old or younger with a rectal temperature of 100.4 F (38 C) or higher. MAKE SURE YOU:   Understand these instructions.  Will watch your condition.  Will get help right away if you are not doing well or get worse. Document Released: 05/08/2005 Document Revised: 07/31/2011 Document Reviewed: 10/03/2012 Exit[employer] Patient Information 2015 [employer]. This information is not intended to replace advice given to you by your health care provider. Make sure you discuss any questions you have with your health care provider.  

## 2014-04-23 NOTE — ED Notes (Signed)
Has part of contact in left eye since 0800 this am

## 2016-06-27 ENCOUNTER — Other Ambulatory Visit: Payer: Self-pay | Admitting: Family Medicine

## 2016-06-27 ENCOUNTER — Ambulatory Visit
Admission: RE | Admit: 2016-06-27 | Discharge: 2016-06-27 | Disposition: A | Discharge status: Home / Self Care | Payer: Managed Care, Other (non HMO) | Source: Ambulatory Visit | Attending: Family Medicine | Admitting: Family Medicine

## 2016-06-27 DIAGNOSIS — G4452 New daily persistent headache (NDPH): Secondary | ICD-10-CM

## 2016-06-27 MED ORDER — IOPAMIDOL (ISOVUE-300) INJECTION 61%
75.0000 mL | Freq: Once | INTRAVENOUS | Status: AC | PRN
  Administered 2016-06-27: 75 mL via INTRAVENOUS

## 2017-01-04 ENCOUNTER — Emergency Department (HOSPITAL_COMMUNITY)
Admission: EM | Admit: 2017-01-04 | Discharge: 2017-01-04 | Disposition: A | Discharge status: Home / Self Care | Payer: Managed Care, Other (non HMO) | Attending: Emergency Medicine | Admitting: Emergency Medicine

## 2017-01-04 ENCOUNTER — Encounter (HOSPITAL_COMMUNITY): Payer: Self-pay | Admitting: Emergency Medicine

## 2017-01-04 DIAGNOSIS — R51 Headache: Secondary | ICD-10-CM | POA: Diagnosis present

## 2017-01-04 DIAGNOSIS — M542 Cervicalgia: Secondary | ICD-10-CM | POA: Insufficient documentation

## 2017-01-04 DIAGNOSIS — R519 Headache, unspecified: Secondary | ICD-10-CM

## 2017-01-04 MED ORDER — METOCLOPRAMIDE HCL 5 MG/ML IJ SOLN
10.0000 mg | Freq: Once | INTRAMUSCULAR | Status: AC
  Administered 2017-01-04: 10 mg via INTRAVENOUS
  Filled 2017-01-04: qty 2

## 2017-01-04 MED ORDER — SODIUM CHLORIDE 0.9 % IV BOLUS (SEPSIS)
1000.0000 mL | Freq: Once | INTRAVENOUS | Status: AC
  Administered 2017-01-04: 1000 mL via INTRAVENOUS

## 2017-01-04 MED ORDER — KETOROLAC TROMETHAMINE 30 MG/ML IJ SOLN
30.0000 mg | Freq: Once | INTRAMUSCULAR | Status: AC
  Administered 2017-01-04: 30 mg via INTRAVENOUS
  Filled 2017-01-04: qty 1

## 2017-01-04 MED ORDER — DIPHENHYDRAMINE HCL 50 MG/ML IJ SOLN
25.0000 mg | Freq: Once | INTRAMUSCULAR | Status: AC
  Administered 2017-01-04: 25 mg via INTRAVENOUS
  Filled 2017-01-04: qty 1

## 2017-01-04 MED ORDER — SUMATRIPTAN SUCCINATE 100 MG PO TABS: 100.0000 mg | ORAL_TABLET | ORAL | 0 refills | Status: AC | PRN

## 2017-01-04 NOTE — Discharge Instructions (Signed)
Schedule to see Neurology for evaluation.  

## 2017-01-04 NOTE — ED Triage Notes (Signed)
Patient here from home with complaints of headache and neck spasms since January. States that this has been an ongoing problem that she has addressed to her PCP. Excedrin OTC with no relief. Nausea/vomiting last weekend, denies currently.

## 2017-01-04 NOTE — ED Provider Notes (Signed)
[facility]-EMERGENCY DEPT Provider Note   CSN: [id number] Arrival date & time: 01/04/17  1552     History   Chief Complaint Chief Complaint  Patient presents with  . Headache  . Neck Pain  . Spasms    HPI Megan Horn is a 41 y.o. female.  The history is provided by the patient. No language interpreter was used.  Headache   This is a new problem. Episode onset: 8 months. The problem occurs constantly. The problem has not changed since onset.The headache is associated with nothing. The pain is located in the occipital region. The quality of the pain is described as sharp. The pain is moderate. The pain does not radiate. She has tried nothing for the symptoms. The treatment provided no relief.  Neck Pain   Associated symptoms include headaches.  Pt reports she has been having headaches since January.  Pt has been taking excedrin but no relief today.  Pt had a ct scan which was normal.    Past Medical History:  Diagnosis Date  . Sickle-cell trait (HCC)     There are no active problems to display for this patient.   Past Surgical History:  Procedure Laterality Date  . CESAREAN SECTION    . TUBAL LIGATION      OB History    No data available       Home Medications    Prior to Admission medications   Medication Sig Start Date End Date Taking? Authorizing Provider  aspirin-acetaminophen-caffeine (EXCEDRIN MIGRAINE) [phone] MG tablet Take 2 tablets by mouth every 6 (six) hours as needed for headache.   Yes [provider]  COLLAGEN PO Take 1 capsule by mouth daily.   Yes [provider]  ferrous sulfate 325 (65 FE) MG tablet Take 325 mg by mouth daily with breakfast.   Yes [provider]  clonazePAM (KLONOPIN) 0.5 MG tablet Take 1 tablet (0.5 mg total) by mouth at bedtime as needed for anxiety. Patient not taking: Reported on 01/04/2017 10/05/11 11/04/11  Wayland Salinas, MD  mirtazapine (REMERON) 15 MG tablet Take 1 tablet (15 mg total) by mouth at  bedtime. Patient not taking: Reported on 01/04/2017 10/05/11 11/04/11  Wayland Salinas, MD  SUMAtriptan (IMITREX) 100 MG tablet Take 1 tablet (100 mg total) by mouth every 2 (two) hours as needed for migraine. May repeat in 2 hours if headache persists or recurs. 01/04/17   Elson Areas, PA-C    Family History No family history on file.  Social History Social History  Substance Use Topics  . Smoking status: Never Smoker  . Smokeless tobacco: Never Used  . Alcohol use Yes     Comment: rare     Allergies   Patient has no known allergies.   Review of Systems Review of Systems  Musculoskeletal: Positive for neck pain.  Neurological: Positive for headaches.  All other systems reviewed and are negative.    Physical Exam Updated Vital Signs BP 109/74 (BP Location: Right Arm)   Pulse 75   Temp 98.4 F (36.9 C) (Oral)   Resp 18   Wt 80 kg (176 lb 4.8 oz)   SpO2 100%   BMI 32.25 kg/m   Physical Exam  Constitutional: She appears well-developed and well-nourished. No distress.  HENT:  Head: Normocephalic and atraumatic.  Right Ear: External ear normal.  Left Ear: External ear normal.  Nose: Nose normal.  Mouth/Throat: Oropharynx is clear and moist.  Eyes: Pupils are equal, round, and reactive to  light. Conjunctivae and EOM are normal.  Neck: Neck supple.  Cardiovascular: Normal rate and regular rhythm.   No murmur heard. Pulmonary/Chest: Effort normal and breath sounds normal. No respiratory distress.  Abdominal: Soft. There is no tenderness.  Musculoskeletal: She exhibits no edema.  Neurological: She is alert.  Skin: Skin is warm and dry.  Psychiatric: She has a normal mood and affect.  Nursing note and vitals reviewed.    ED Treatments / Results  Labs (all labs ordered are listed, but only abnormal results are displayed) Labs Reviewed - No data to display  EKG  EKG Interpretation None       Radiology No results found.  Procedures Procedures (including  critical care time)  Medications Ordered in ED Medications  sodium chloride 0.9 % bolus 1,000 mL (0 mLs Intravenous Stopped 01/04/17 2052)  metoCLOPramide (REGLAN) injection 10 mg (10 mg Intravenous Given 01/04/17 2000)  diphenhydrAMINE (BENADRYL) injection 25 mg (25 mg Intravenous Given 01/04/17 2000)  ketorolac (TORADOL) 30 MG/ML injection 30 mg (30 mg Intravenous Given 01/04/17 2000)     Initial Impression / Assessment and Plan / ED Course  I have reviewed the triage vital signs and the nursing notes.  Pertinent labs & imaging results that were available during my care of the patient were reviewed by me and considered in my medical decision making (see chart for details).     Pt given Iv fluids x 1 liter, reglan, torodol and benadryl.  Pt reports she feels better.  I discussed medications with pt.  I wll try her on imitrex oral.   Pt is to see neurology for evaluation.   Final Clinical Impressions(s) / ED Diagnoses   Final diagnoses:  Bad headache    New Prescriptions Discharge Medication List as of 01/04/2017  9:48 PM    START taking these medications   Details  SUMAtriptan (IMITREX) 100 MG tablet Take 1 tablet (100 mg total) by mouth every 2 (two) hours as needed for migraine. May repeat in 2 hours if headache persists or recurs., Starting Thu 01/04/2017, Print         Elson AreasSofia, Reighn Kaplan K, PA-C 01/04/17 2220    Arby BarrettePfeiffer, Marcy, MD 01/14/17 2135

## 2017-01-05 ENCOUNTER — Encounter: Payer: Self-pay | Admitting: Neurology

## 2017-01-11 ENCOUNTER — Encounter (HOSPITAL_COMMUNITY): Payer: Self-pay | Admitting: Emergency Medicine

## 2017-01-11 ENCOUNTER — Emergency Department (HOSPITAL_COMMUNITY)
Admission: EM | Admit: 2017-01-11 | Discharge: 2017-01-11 | Disposition: A | Discharge status: Home / Self Care | Payer: Managed Care, Other (non HMO) | Attending: Emergency Medicine | Admitting: Emergency Medicine

## 2017-01-11 ENCOUNTER — Emergency Department (HOSPITAL_COMMUNITY): Payer: Managed Care, Other (non HMO)

## 2017-01-11 DIAGNOSIS — G43909 Migraine, unspecified, not intractable, without status migrainosus: Secondary | ICD-10-CM | POA: Diagnosis present

## 2017-01-11 DIAGNOSIS — R519 Headache, unspecified: Secondary | ICD-10-CM

## 2017-01-11 DIAGNOSIS — R51 Headache: Secondary | ICD-10-CM | POA: Insufficient documentation

## 2017-01-11 LAB — CBC WITH DIFFERENTIAL/PLATELET
Basophils Absolute: 0 10*3/uL (ref 0.0–0.1)
Basophils Relative: 1 %
EOS ABS: 0.2 10*3/uL (ref 0.0–0.7)
EOS PCT: 3 %
HCT: 38.1 % (ref 36.0–46.0)
HEMOGLOBIN: 12.6 g/dL (ref 12.0–15.0)
LYMPHS ABS: 2.1 10*3/uL (ref 0.7–4.0)
Lymphocytes Relative: 30 %
MCH: 26.2 pg (ref 26.0–34.0)
MCHC: 33.1 g/dL (ref 30.0–36.0)
MCV: 79.2 fL (ref 78.0–100.0)
MONOS PCT: 7 %
Monocytes Absolute: 0.5 10*3/uL (ref 0.1–1.0)
NEUTROS PCT: 59 %
Neutro Abs: 4.3 10*3/uL (ref 1.7–7.7)
Platelets: 320 10*3/uL (ref 150–400)
RBC: 4.81 MIL/uL (ref 3.87–5.11)
RDW: 14.4 % (ref 11.5–15.5)
WBC: 7.1 10*3/uL (ref 4.0–10.5)

## 2017-01-11 LAB — BASIC METABOLIC PANEL
ANION GAP: 8 (ref 5–15)
BUN: 11 mg/dL (ref 6–20)
CALCIUM: 8.9 mg/dL (ref 8.9–10.3)
CHLORIDE: 104 mmol/L (ref 101–111)
CO2: 25 mmol/L (ref 22–32)
Creatinine, Ser: 0.75 mg/dL (ref 0.44–1.00)
GFR calc Af Amer: >60 mL/min (ref >60)
GFR calc non Af Amer: >60 mL/min (ref >60)
GLUCOSE: 100 mg/dL — AB (ref 65–99)
Potassium: 3.5 mmol/L (ref 3.5–5.1)
Sodium: 137 mmol/L (ref 135–145)

## 2017-01-11 LAB — I-STAT BETA HCG BLOOD, ED (MC, WL, AP ONLY)

## 2017-01-11 MED ORDER — MAGNESIUM SULFATE 2 GM/50ML IV SOLN
2.0000 g | Freq: Once | INTRAVENOUS | Status: AC
  Administered 2017-01-11: 2 g via INTRAVENOUS
  Filled 2017-01-11: qty 50

## 2017-01-11 MED ORDER — METHOCARBAMOL 750 MG PO TABS: 750.0000 mg | ORAL_TABLET | Freq: Two times a day (BID) | ORAL | 0 refills | Status: AC

## 2017-01-11 MED ORDER — DEXAMETHASONE SODIUM PHOSPHATE 10 MG/ML IJ SOLN
10.0000 mg | Freq: Once | INTRAMUSCULAR | Status: AC
  Administered 2017-01-11: 10 mg via INTRAVENOUS
  Filled 2017-01-11: qty 1

## 2017-01-11 NOTE — ED Provider Notes (Signed)
[facility]-EMERGENCY DEPT Provider Note   CSN: [id number] Arrival date & time: 01/11/17  0018     History   Chief Complaint Chief Complaint  Patient presents with  . Migraine    HPI Megan Horn is a 41 y.o. female.  HPI Megan Horn is a 41 y.o. femalepresents to emergency department complaining of headache. Patient states she has had recurrent headaches for the last 8 months. When the headaches initially started she was seen and had a CT scan which was negative. She states in the last 2 weeks the headaches became more frequent and more severe. States pain is mainly in the posterior head and radiates into the neck. She reports associated photophobia, phonophobia, intermittently blurred vision, floaters, nausea, and at times vomiting. She was seen in the emergency department several days ago, treated with migraine cocktail, discharge home with Imitrex. She states that she felt better with medications but as soon as she got home her headache came back. She states Imitrex is not helping.She also states she is taking Excedrin Migraine which is not helping. She states that the headache is worse at nighttime. Sometimes wakes her up from sleep multiple times a night. She states she is unable to sleep. She went and saw ophthalmologist 4 months ago and was told that her eye exam was normal. Denies any numbness or weakness in extremities. Denies any difficulty ambulating or speaking.  Past Medical History:  Diagnosis Date  . Sickle-cell trait (HCC)     There are no active problems to display for this patient.   Past Surgical History:  Procedure Laterality Date  . CESAREAN SECTION    . TUBAL LIGATION      OB History    No data available       Home Medications    Prior to Admission medications   Medication Sig Start Date End Date Taking? Authorizing Provider  aspirin-acetaminophen-caffeine (EXCEDRIN MIGRAINE) [phone] MG tablet Take 2 tablets by mouth every 6 (six) hours as needed for  headache.   Yes [provider]  COLLAGEN PO Take 1 capsule by mouth daily.   Yes [provider]  ibuprofen (ADVIL,MOTRIN) 200 MG tablet Take 200 mg by mouth every 6 (six) hours as needed for headache.   Yes [provider]  SUMAtriptan (IMITREX) 100 MG tablet Take 1 tablet (100 mg total) by mouth every 2 (two) hours as needed for migraine. May repeat in 2 hours if headache persists or recurs. 01/04/17  Yes Cheron Schaumann K, PA-C  clonazePAM (KLONOPIN) 0.5 MG tablet Take 1 tablet (0.5 mg total) by mouth at bedtime as needed for anxiety. Patient not taking: Reported on 01/11/2017 10/05/11 01/11/17  Wayland Salinas, MD  mirtazapine (REMERON) 15 MG tablet Take 1 tablet (15 mg total) by mouth at bedtime. Patient not taking: Reported on 01/11/2017 10/05/11 01/11/17  Wayland Salinas, MD    Family History No family history on file.  Social History Social History  Substance Use Topics  . Smoking status: Never Smoker  . Smokeless tobacco: Never Used  . Alcohol use Yes     Comment: rare     Allergies   Patient has no known allergies.   Review of Systems Review of Systems  Constitutional: Negative for chills and fever.  Eyes: Positive for photophobia and visual disturbance. Negative for pain.  Respiratory: Negative for cough, chest tightness and shortness of breath.   Cardiovascular: Negative for chest pain, palpitations and leg swelling.  Gastrointestinal: Negative for abdominal pain, diarrhea,  nausea and vomiting.  Genitourinary: Negative for dysuria, flank pain and pelvic pain.  Musculoskeletal: Positive for neck pain. Negative for arthralgias, myalgias and neck stiffness.  Skin: Negative for rash.  Neurological: Positive for dizziness and headaches. Negative for weakness.  All other systems reviewed and are negative.    Physical Exam Updated Vital Signs BP 107/74   Pulse 84   Temp 99.1 F (37.3 C) (Oral)   Resp 18   Ht 5\' 1"  (1.549 m)   Wt 72.6 kg (160 lb)    SpO2 100%   BMI 30.23 kg/m   Physical Exam  Constitutional: She is oriented to person, place, and time. She appears well-developed and well-nourished. No distress.  HENT:  Head: Normocephalic and atraumatic.  Eyes: Pupils are equal, round, and reactive to light. Conjunctivae and EOM are normal.  Neck: Normal range of motion. Neck supple.  ttp over bilateral trapezius and base of scull  Cardiovascular: Normal rate, regular rhythm and normal heart sounds.   Pulmonary/Chest: Effort normal and breath sounds normal. No respiratory distress. She has no wheezes. She has no rales.  Musculoskeletal: Normal range of motion. She exhibits no edema.  Neurological: She is alert and oriented to person, place, and time. No cranial nerve deficit. Coordination normal.  5/5 and equal upper and lower extremity strength bilaterally. Equal grip strength bilaterally. Normal finger to nose and heel to shin. No pronator drift. Patellar reflexes 2+   Skin: Skin is warm and dry.  Psychiatric: She has a normal mood and affect. Her behavior is normal.  Nursing note and vitals reviewed.    ED Treatments / Results  Labs (all labs ordered are listed, but only abnormal results are displayed) Labs Reviewed  BASIC METABOLIC PANEL - Abnormal; Notable for the following:       Result Value   Glucose, Bld 100 (*)    All other components within normal limits  CBC WITH DIFFERENTIAL/PLATELET  I-STAT BETA HCG BLOOD, ED ([facility], [facility], [facility] ONLY)    EKG  EKG Interpretation None       Radiology Ct Head Wo Contrast  Result Date: 01/11/2017 CLINICAL DATA:  Headache for 8 months, worsened over the past 3 weeks. Intermittent blurry vision and dizziness. EXAM: CT HEAD WITHOUT CONTRAST TECHNIQUE: Contiguous axial images were obtained from the base of the skull through the vertex without intravenous contrast. COMPARISON:  Head CT 06/27/2016 FINDINGS: Brain: No intracranial hemorrhage, mass effect, or midline shift. No  hydrocephalus. The basilar cisterns are patent. No evidence of territorial infarct or acute ischemia. No extra-axial or intracranial fluid collection. Vascular: No hyperdense vessel or unexpected calcification. Skull: Normal. Negative for fracture or focal lesion. Sinuses/Orbits: Paranasal sinuses and mastoid air cells are clear. Tiny mucous retention cyst in the left maxillary sinus. The visualized orbits are unremarkable. Other: None. IMPRESSION: No acute intracranial abnormality or explanation for headache. Electronically Signed   By: Rubye Oaks M.D.   On: 01/11/2017 06:16    Procedures Procedures (including critical care time)  Medications Ordered in ED Medications  dexamethasone (DECADRON) injection 10 mg (10 mg Intravenous Given 01/11/17 0451)  magnesium sulfate IVPB 2 g 50 mL (2 g Intravenous New Bag/Given 01/11/17 0451)     Initial Impression / Assessment and Plan / ED Course  I have reviewed the triage vital signs and the nursing notes.  Pertinent labs & imaging results that were available during my care of the patient were reviewed by me and considered in my medical decision making (see chart  for details).    Patient with no diagnosis of migraines, presents with worsening headaches for the last 8 months, worse in the last 2 weeks. The patient's headache is waking her up from sleep. She is unable to sleep at night. We'll get imaging of the brain, CT ordered. Will check basic labs to rule out anemia, much abnormality, kidney failure. Magnesium and decadron ordered  6:38 AM Pt feeling better. CT and labs normal. Will try muscle relaxants. Wondering if spasms causing her pain. She is neurovascularly intact. Instructed for follow up with a specialist.   Vitals:   01/11/17 0033 01/11/17 0345 01/11/17 0400 01/11/17 0415  BP:  108/69 113/68 107/74  Pulse:  69 66 84  Resp:      Temp:      TempSrc:      SpO2:  100% 100% 100%  Weight: 72.6 kg (160 lb)     Height: 5\' 1"  (1.549 m)         Final Clinical Impressions(s) / ED Diagnoses   Final diagnoses:  Nonintractable headache, unspecified chronicity pattern, unspecified headache type    New Prescriptions New Prescriptions   METHOCARBAMOL (ROBAXIN) 750 MG TABLET    Take 1 tablet (750 mg total) by mouth 2 (two) times daily.     Jaynie Crumble, PA-C 01/11/17 2035    Pricilla Loveless, MD 01/15/17 [phone]

## 2017-01-11 NOTE — Discharge Instructions (Signed)
Take robaxin as prescribed. Try stretches, massages, heating pad. Follow up with headache specialist.

## 2017-01-11 NOTE — ED Triage Notes (Signed)
Pt c/o intermittent migraines since January. Pt reports migraines with sensitivity to light and sound, everyday x 1 week. Pt seen at [facility] on Thursday for same.

## 2017-01-30 ENCOUNTER — Encounter: Payer: Self-pay | Admitting: Neurology

## 2017-01-30 ENCOUNTER — Other Ambulatory Visit: Payer: Self-pay | Admitting: Internal Medicine

## 2017-01-30 DIAGNOSIS — Z1231 Encounter for screening mammogram for malignant neoplasm of breast: Secondary | ICD-10-CM

## 2017-03-01 ENCOUNTER — Ambulatory Visit: Payer: Managed Care, Other (non HMO) | Admitting: Neurology

## 2017-03-22 ENCOUNTER — Ambulatory Visit (INDEPENDENT_AMBULATORY_CARE_PROVIDER_SITE_OTHER): Payer: Managed Care, Other (non HMO) | Admitting: Neurology

## 2017-03-22 ENCOUNTER — Encounter: Payer: Self-pay | Admitting: Neurology

## 2017-03-22 VITALS — BP 130/88 | HR 86 | Ht 61.0 in | Wt 179.2 lb

## 2017-03-22 DIAGNOSIS — G43009 Migraine without aura, not intractable, without status migrainosus: Secondary | ICD-10-CM | POA: Diagnosis not present

## 2017-03-22 DIAGNOSIS — G4440 Drug-induced headache, not elsewhere classified, not intractable: Secondary | ICD-10-CM | POA: Diagnosis not present

## 2017-03-22 DIAGNOSIS — M542 Cervicalgia: Secondary | ICD-10-CM | POA: Diagnosis not present

## 2017-03-22 NOTE — Patient Instructions (Signed)
1.  Take sumatriptan 50 to 100mg  at earliest onset of headache.  May repeat dose once in 2 hours if needed.  Do not exceed two doses in 24 hours.  Limit use  to no more than 2 days out of the week.  2.  Stop Excedrin or other pain relievers.  3.  Take the following over the counter supplements:  Magnesium citrate 400mg  to 600mg  daily, riboflavin 400mg , Coenzyme Q 10 100mg  three times daily, turmeric 500mg  daily. 4.  We will refer you to Dr. Antoine [facility]Zachary Smith at [facility]. 5.  Follow up with me in 3 months.

## 2017-03-22 NOTE — Progress Notes (Signed)
NEUROLOGY CONSULTATION NOTE  Megan Horn MRN: [id number] DOB: [birth year]  Referring provider: ED referral Primary care provider: Dr. Selena Batten  Reason for consult:  headache  HISTORY OF PRESENT ILLNESS: Megan Horn is a 41 year old female who presents for headache.  History supplemented by ED notes.  She started having episodic severe headaches in January 2018.  The are throbbing, occipital and radiated down the neck.  They would last all day. There was associated nausea, vomiting, photophobia and phonophobia. Sitting at her computer exacerbated or triggered it.  Aleve and Excedrin were effective.  She began taking Excedrin and Aleve daily.  The headaches became daily in June, lasting from morning to afternoon.  She was prescribed sumatriptan 100mg , which was effective but caused drowsiness and she needed to sleep.  She was prescribed Robaxin, which helped with neck pain.  She was subsequently started on Trokendi XR 25mg  daily, however it caused tingling on her head.  About 2 weeks ago, she decided to discontinue all of her medications.  Over the past week, headaches have improved.  She has had 2 headaches over the past week.  They last a day but are moderate intensity.  She had no recent injury just prior to headache onset, but she had history of head injury a couple of years ago in which she slipped on the ice hitting the back of her head and sustaining neck pain.  She underwent PT at that time.  Caffeine:  1 cup coffee daily Alcohol:  no Smoker:  no Diet:  Hydrates. Exercise:  walks Depression/anxiety:  no Sleep hygiene:  good Family history of headache:  no  CT of head from 06/27/16 and 01/11/17 were both personally reviewed and were unremarkable.  01/11/17 BMP:  Na 137, K 3.5, Cl 104, CO2 25, glucose 100, BUN 11, Cr 0.75  PAST MEDICAL HISTORY: Past Medical History:  Diagnosis Date  . Sickle-cell trait (HCC)     PAST SURGICAL HISTORY: Past Surgical History:  Procedure  Laterality Date  . CESAREAN SECTION    . TUBAL LIGATION      MEDICATIONS: Current Outpatient Prescriptions on File Prior to Visit  Medication Sig Dispense Refill  . aspirin-acetaminophen-caffeine (EXCEDRIN MIGRAINE) [id number] MG tablet Take 2 tablets by mouth every 6 (six) hours as needed for headache.    . ibuprofen (ADVIL,MOTRIN) 200 MG tablet Take 200 mg by mouth every 6 (six) hours as needed for headache.    . methocarbamol (ROBAXIN) 750 MG tablet Take 1 tablet (750 mg total) by mouth 2 (two) times daily. 20 tablet 0  . SUMAtriptan (IMITREX) 100 MG tablet Take 1 tablet (100 mg total) by mouth every 2 (two) hours as needed for migraine. May repeat in 2 hours if headache persists or recurs. 10 tablet 0   No current facility-administered medications on file prior to visit.     ALLERGIES: No Known Allergies  FAMILY HISTORY: No family history on file.  SOCIAL HISTORY: Social History   Social History  . Marital status: Married    Spouse name: N/A  . Number of children: 3  . Years of education: 15   Occupational History  . Not on file.   Social History Main Topics  . Smoking status: Never Smoker  . Smokeless tobacco: Never Used  . Alcohol use Yes     Comment: rare  . Drug use: No  . Sexual activity: Yes    Birth control/ protection: Surgical   Other Topics Concern  . Not  on file   Social History Narrative   Lives with husband in one story home. Has 3 children.  Works for [employer].  Education: 15 years.     REVIEW OF SYSTEMS: Constitutional: No fevers, chills, or sweats, no generalized fatigue, change in appetite Eyes: No visual changes, double vision, eye pain Ear, nose and throat: No hearing loss, ear pain, nasal congestion, sore throat Cardiovascular: No chest pain, palpitations Respiratory:  No shortness of breath at rest or with exertion, wheezes GastrointestinaI: No nausea, vomiting, diarrhea, abdominal pain, fecal  incontinence Genitourinary:  No dysuria, urinary retention or frequency Musculoskeletal:  Neck pain Integumentary: No rash, pruritus, skin lesions Neurological: as above Psychiatric: No depression, insomnia, anxiety Endocrine: No palpitations, fatigue, diaphoresis, mood swings, change in appetite, change in weight, increased thirst Hematologic/Lymphatic:  No purpura, petechiae. Allergic/Immunologic: no itchy/runny eyes, nasal congestion, recent allergic reactions, rashes  PHYSICAL EXAM: Vitals:   03/22/17 1247  BP: 130/88  Pulse: 86  SpO2: 98%   General: No acute distress.  Patient appears well-groomed.  Head:  Normocephalic/atraumatic Eyes:  fundi examined but not visualized Neck: supple, mild bilateral suboccipital and paraspinal tenderness, full range of motion Back: No paraspinal tenderness Heart: regular rate and rhythm Lungs: Clear to auscultation bilaterally. Vascular: No carotid bruits. Neurological Exam: Mental status: alert and oriented to person, place, and time, recent and remote memory intact, fund of knowledge intact, attention and concentration intact, speech fluent and not dysarthric, language intact. Cranial nerves: CN I: not tested CN II: pupils equal, round and reactive to light, visual fields intact CN III, IV, VI:  full range of motion, no nystagmus, no ptosis CN V: facial sensation intact CN VII: upper and lower face symmetric CN VIII: hearing intact CN IX, X: gag intact, uvula midline CN XI: sternocleidomastoid and trapezius muscles intact CN XII: tongue midline Bulk & Tone: normal, no fasciculations. Motor:  5/5 throughout Sensation temperature and vibration sensation intact. Deep Tendon Reflexes:  2+ throughout, toes downgoing.  Finger to nose testing:  Without dysmetria.  Heel to shin:  Without dysmetria.  Gait:  Normal station and stride.  Able to turn and tandem walk. Romberg negative.  IMPRESSION: Migraine with cervicalgia, complicated by  medication overuse.  PLAN: 1.  She will try magnesium citrate, riboflavin, CoQ10 and turmeric 2.  She will be referred to Dr. Antoine PrimasZachary Smith for OMM 3.  Remain off of Excedrin and Aleve.  May use sumatriptan as needed, limited to no more than 2 days out of the week to prevent rebound headache.  She may try 50mg  to see if it causes less drowsiness. 4.  She will return in 3 months.  If headaches not improved, we will discuss a daily preventative medication.  Thank you for allowing me to take part in the care of this patient.  Shon MilletAdam Rumaysa Sabatino, DO  CC:  Pearson GrippeJames Kim, MD

## 2017-03-28 ENCOUNTER — Ambulatory Visit
Admission: RE | Admit: 2017-03-28 | Discharge: 2017-03-28 | Disposition: A | Discharge status: Home / Self Care | Payer: Managed Care, Other (non HMO) | Source: Ambulatory Visit | Attending: Internal Medicine | Admitting: Internal Medicine

## 2017-03-28 DIAGNOSIS — Z1231 Encounter for screening mammogram for malignant neoplasm of breast: Secondary | ICD-10-CM

## 2017-03-29 ENCOUNTER — Other Ambulatory Visit: Payer: Self-pay | Admitting: Internal Medicine

## 2017-03-29 DIAGNOSIS — R928 Other abnormal and inconclusive findings on diagnostic imaging of breast: Secondary | ICD-10-CM

## 2017-04-06 ENCOUNTER — Ambulatory Visit
Admission: RE | Admit: 2017-04-06 | Discharge: 2017-04-06 | Disposition: A | Discharge status: Home / Self Care | Payer: Managed Care, Other (non HMO) | Source: Ambulatory Visit | Attending: Internal Medicine | Admitting: Internal Medicine

## 2017-04-06 DIAGNOSIS — R928 Other abnormal and inconclusive findings on diagnostic imaging of breast: Secondary | ICD-10-CM

## 2017-04-19 ENCOUNTER — Encounter: Payer: Self-pay | Admitting: Family Medicine

## 2017-06-25 ENCOUNTER — Ambulatory Visit: Payer: Managed Care, Other (non HMO) | Admitting: Neurology

## 2018-07-25 IMAGING — CT CT HEAD W/O CM
4 series · 16 of 47 positions shown, 18 images · non-contrast
Comparison: Head CT 06/27/2016

CLINICAL DATA: Headache for 8 months, worsened over the past 3
weeks. Intermittent blurry vision and dizziness.

EXAM:
CT HEAD WITHOUT CONTRAST
TECHNIQUE: Contiguous axial images were obtained from the base of the skull
through the vertex without intravenous contrast.

[Series 3: head wo · axial · 0.39mm/px · z∈[-116,+4]mm · 7 of 32 slices shown, 9 images]
[im 4/32  brain]
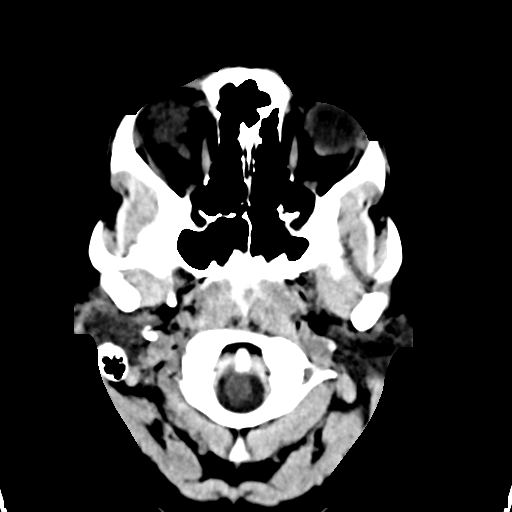
[im 4/32  bone]
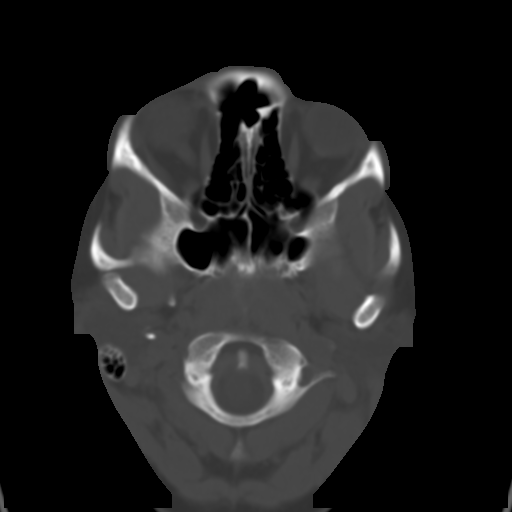
[im 8/32  brain]
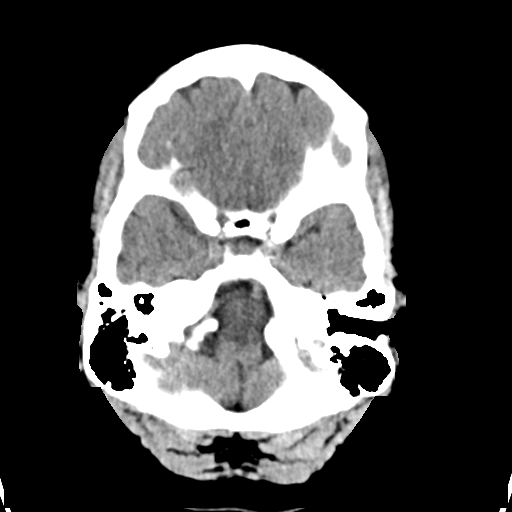
[im 12/32  brain]
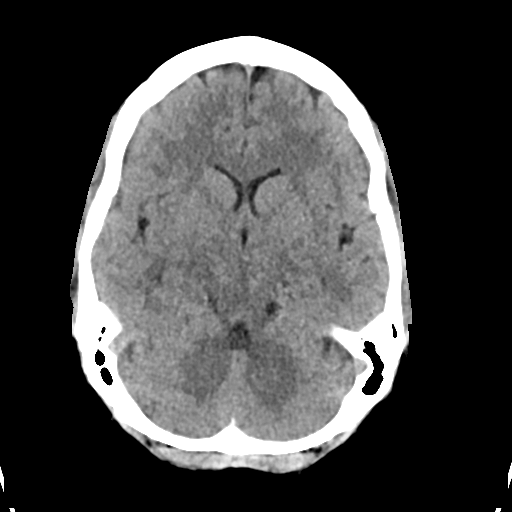
[im 16/32  brain]
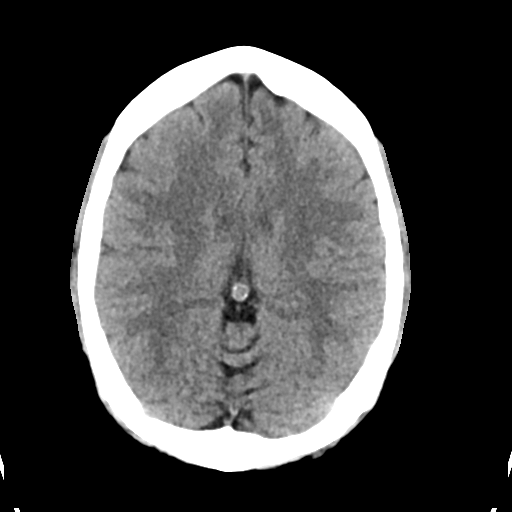
[im 20/32  brain]
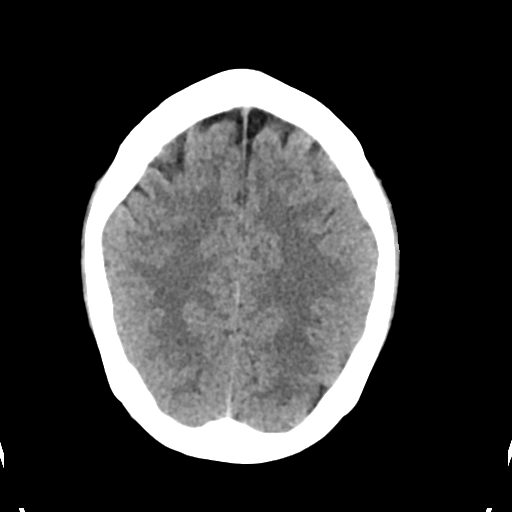
[im 20/32  bone]
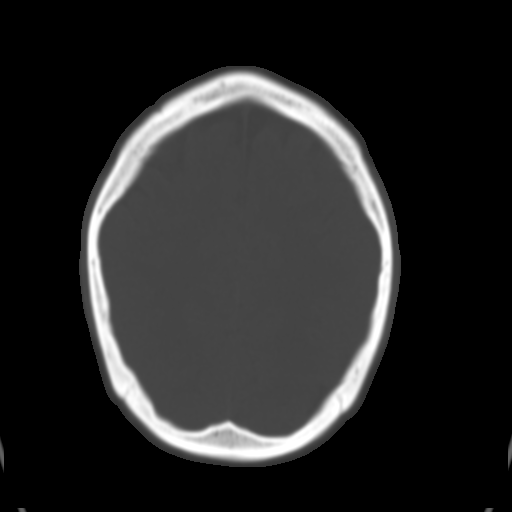
[im 24/32  brain]
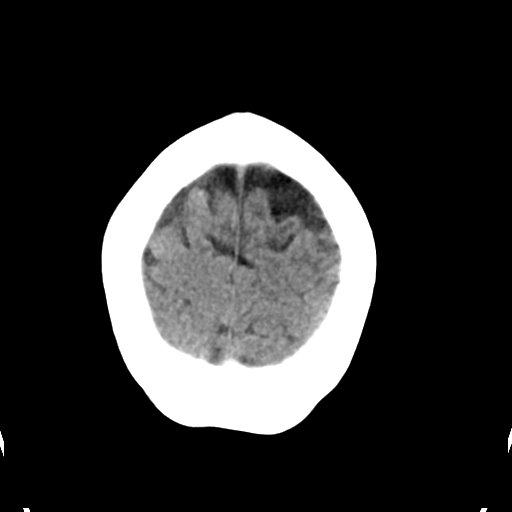
[im 28/32  brain]
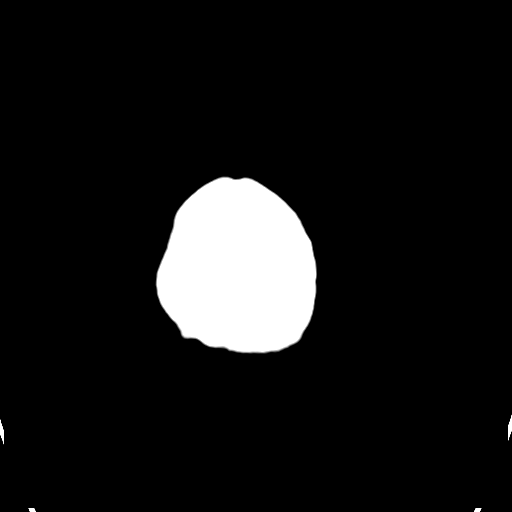

[Series 4: head bone · axial · 0.39mm/px · z∈[-118,-86]mm · 3 of 79 slices shown]
[im 8/79  bone]
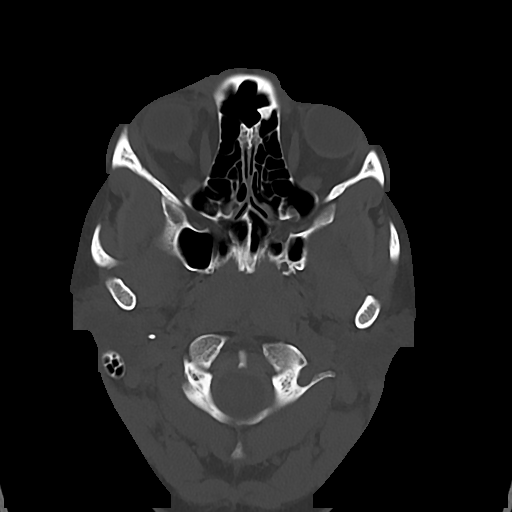
[im 16/79  bone]
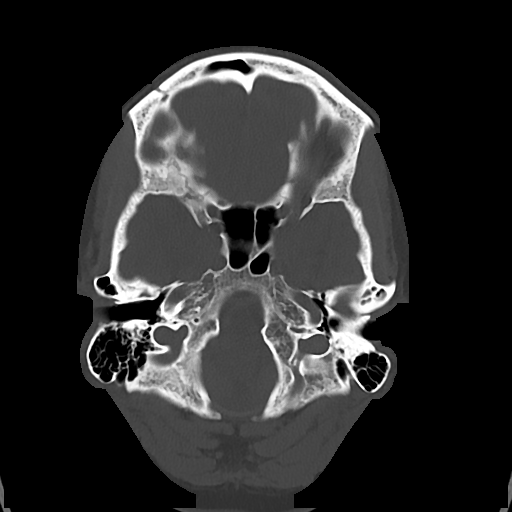
[im 24/79  bone]
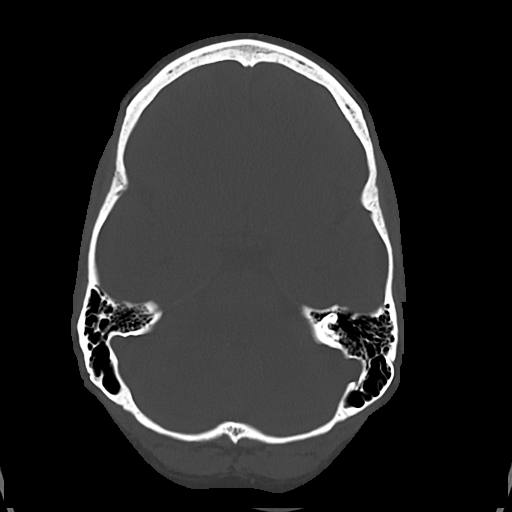

[Series 5: cor soft · coronal · 0.30mm/px · 3 of 67 slices shown]
[im 23/67  brain]
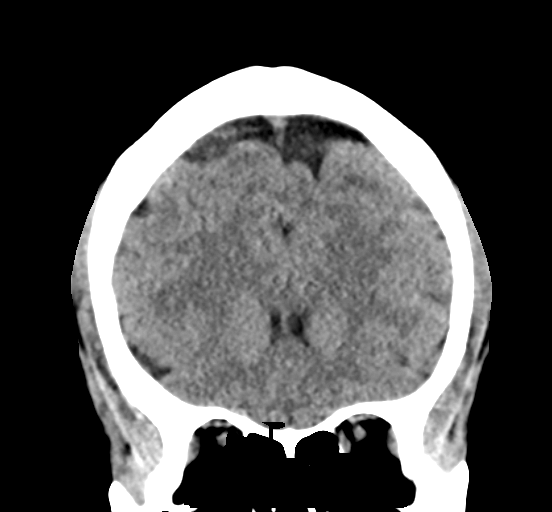
[im 30/67  brain]
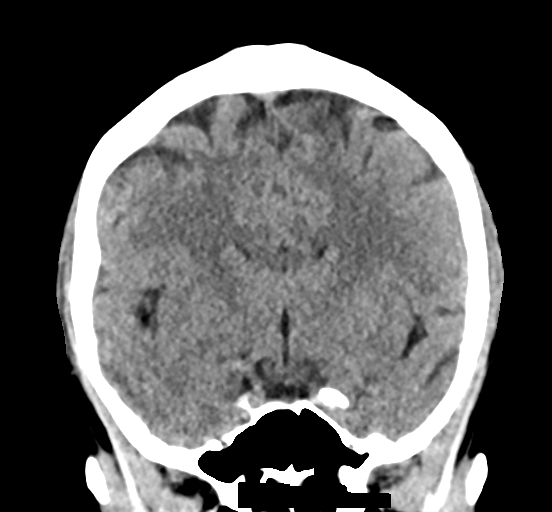
[im 37/67  brain]
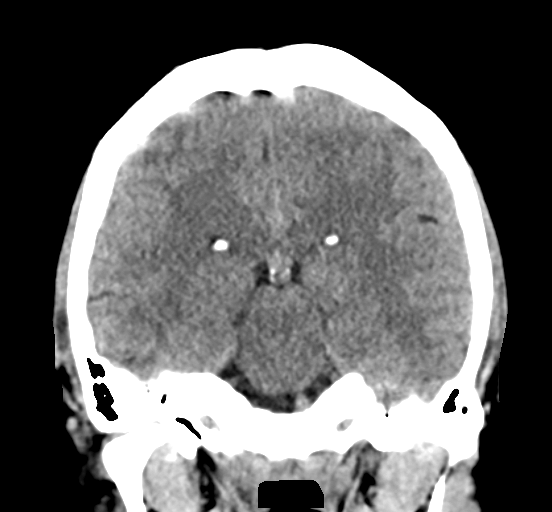

[Series 6: sag soft · sagittal · 0.30mm/px · 3 of 55 slices shown]
[im 19/55  brain]
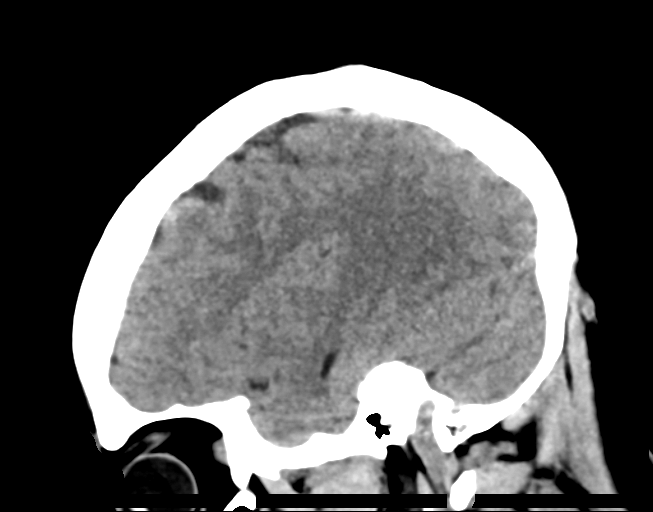
[im 28/55  brain]
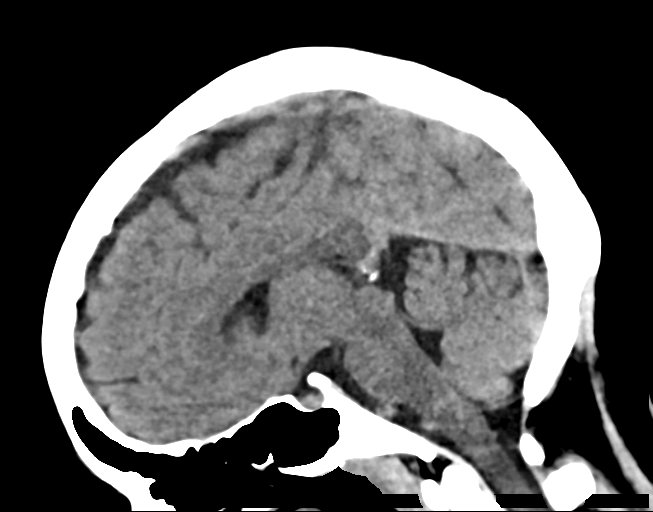
[im 37/55  brain]
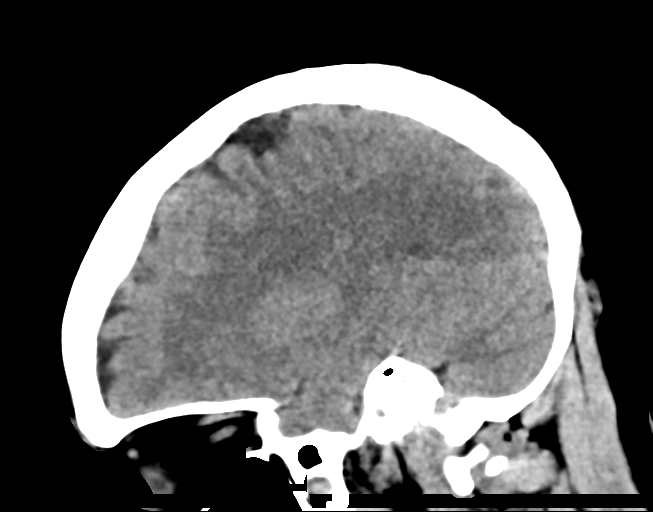

[16 of 47 positions shown; findings below may reference images not displayed]

FINDINGS: Brain: No intracranial hemorrhage, mass effect, or midline shift. No
hydrocephalus. The basilar cisterns are patent. No evidence of
territorial infarct or acute ischemia. No extra-axial or
intracranial fluid collection.

Vascular: No hyperdense vessel or unexpected calcification.

Skull: Normal. Negative for fracture or focal lesion.

Sinuses/Orbits: Paranasal sinuses and mastoid air cells are clear.
Tiny mucous retention cyst in the left maxillary sinus. The
visualized orbits are unremarkable.

Other: None.
IMPRESSION: No acute intracranial abnormality or explanation for headache.
